# Patient Record
Sex: Female | Born: 1950
Health system: Southern US, Community
[De-identification: ages and names within clinical notes are randomized; demographics above are authoritative.]

## PROBLEM LIST (undated history)

## (undated) DIAGNOSIS — R0600 Dyspnea, unspecified: Secondary | ICD-10-CM

## (undated) DIAGNOSIS — T8859XA Other complications of anesthesia, initial encounter: Secondary | ICD-10-CM

## (undated) DIAGNOSIS — Z9889 Other specified postprocedural states: Secondary | ICD-10-CM

## (undated) DIAGNOSIS — H409 Unspecified glaucoma: Secondary | ICD-10-CM

## (undated) DIAGNOSIS — I739 Peripheral vascular disease, unspecified: Secondary | ICD-10-CM

## (undated) DIAGNOSIS — J449 Chronic obstructive pulmonary disease, unspecified: Secondary | ICD-10-CM

## (undated) DIAGNOSIS — R112 Nausea with vomiting, unspecified: Secondary | ICD-10-CM

## (undated) DIAGNOSIS — K219 Gastro-esophageal reflux disease without esophagitis: Secondary | ICD-10-CM

## (undated) HISTORY — PX: TONSILLECTOMY: SUR1361

## (undated) HISTORY — DX: Chronic obstructive pulmonary disease, unspecified: J44.9

## (undated) HISTORY — PX: ROTATOR CUFF REPAIR: SHX139

## (undated) HISTORY — PX: EYE SURGERY: SHX253

## (undated) HISTORY — PX: ABDOMINAL HYSTERECTOMY: SHX81

## (undated) HISTORY — DX: Unspecified glaucoma: H40.9

## (undated) HISTORY — DX: Peripheral vascular disease, unspecified: I73.9

---

## 2010-10-09 ENCOUNTER — Ambulatory Visit (HOSPITAL_COMMUNITY): Admission: RE | Admit: 2010-10-09 | Discharge: 2010-10-09 | Payer: Self-pay | Admitting: Gastroenterology

## 2011-02-13 ENCOUNTER — Ambulatory Visit (HOSPITAL_COMMUNITY): Payer: Self-pay | Admitting: Oncology

## 2015-08-22 ENCOUNTER — Ambulatory Visit (INDEPENDENT_AMBULATORY_CARE_PROVIDER_SITE_OTHER): Payer: Self-pay | Admitting: Neurology

## 2015-08-22 ENCOUNTER — Ambulatory Visit (INDEPENDENT_AMBULATORY_CARE_PROVIDER_SITE_OTHER): Payer: Managed Care, Other (non HMO) | Admitting: Neurology

## 2015-08-22 DIAGNOSIS — R202 Paresthesia of skin: Secondary | ICD-10-CM

## 2015-08-22 DIAGNOSIS — Z0289 Encounter for other administrative examinations: Secondary | ICD-10-CM

## 2015-08-22 NOTE — Procedures (Signed)
   NCS (NERVE CONDUCTION STUDY) WITH EMG (ELECTROMYOGRAPHY) REPORT   STUDY DATE: August 22 2015 PATIENT NAME: Tami Dickson DOB: Dec 09, 1950 MRN: [id number]    TECHNOLOGIST: Laretta Alstrom ELECTROMYOGRAPHER: Marcial Pacas M.D.  CLINICAL INFORMATION:  64 year old female with few months history of low back pain, top of left foot pain, paresthesia involving left second to forced toes.  FINDINGS: NERVE CONDUCTION STUDY: Bilateral peroneal sensory responses were normal. Bilateral peroneal to EDB, tibial motor responses were normal. Bilateral tibial H reflexes were normal and symmetric.  NEEDLE ELECTROMYOGRAPHY: Selective needle examinations were performed at left lower extremity muscles, left lumbar sacral paraspinal muscles.  Needle examination of left tibialis anterior, tibialis posterior, peroneal longus, medial gastrocnemius, vastus lateralis, biceps femoris short head, gluteus medius was normal.  There was no spontaneous activity at left lumbosacral paraspinal muscles.  IMPRESSION:  This is a normal study. There is no electrodiagnostic evidence of peripheral neuropathy, or left lumbosacral radiculopathy.    INTERPRETING PHYSICIAN:   Marcial Pacas M.D. Ph.D. [facility] [address] [place], [place] [address] [phone]

## 2015-08-22 NOTE — Progress Notes (Signed)
Electrodiagnostic study today is normal, there is no evidence of large fiber peripheral neuropathy, or left lumbosacral radiculopathy.

## 2018-04-17 DIAGNOSIS — Z780 Asymptomatic menopausal state: Secondary | ICD-10-CM | POA: Diagnosis not present

## 2018-04-17 DIAGNOSIS — E2839 Other primary ovarian failure: Secondary | ICD-10-CM | POA: Diagnosis not present

## 2018-06-06 DIAGNOSIS — Z79899 Other long term (current) drug therapy: Secondary | ICD-10-CM | POA: Diagnosis not present

## 2018-06-06 DIAGNOSIS — S81801A Unspecified open wound, right lower leg, initial encounter: Secondary | ICD-10-CM | POA: Diagnosis not present

## 2018-06-06 DIAGNOSIS — E7800 Pure hypercholesterolemia, unspecified: Secondary | ICD-10-CM | POA: Diagnosis not present

## 2018-06-06 DIAGNOSIS — I83891 Varicose veins of right lower extremities with other complications: Secondary | ICD-10-CM | POA: Diagnosis not present

## 2018-06-06 DIAGNOSIS — Z888 Allergy status to other drugs, medicaments and biological substances status: Secondary | ICD-10-CM | POA: Diagnosis not present

## 2018-06-06 DIAGNOSIS — W228XXA Striking against or struck by other objects, initial encounter: Secondary | ICD-10-CM | POA: Diagnosis not present

## 2018-07-22 DIAGNOSIS — M7061 Trochanteric bursitis, right hip: Secondary | ICD-10-CM | POA: Diagnosis not present

## 2018-07-22 DIAGNOSIS — Z6837 Body mass index (BMI) 37.0-37.9, adult: Secondary | ICD-10-CM | POA: Diagnosis not present

## 2018-07-23 ENCOUNTER — Other Ambulatory Visit: Payer: Self-pay

## 2018-07-23 DIAGNOSIS — I83893 Varicose veins of bilateral lower extremities with other complications: Secondary | ICD-10-CM

## 2018-07-25 ENCOUNTER — Ambulatory Visit (HOSPITAL_COMMUNITY)
Admission: RE | Admit: 2018-07-25 | Discharge: 2018-07-25 | Disposition: A | Discharge status: Home / Self Care | Payer: Managed Care, Other (non HMO) | Source: Ambulatory Visit | Attending: Family | Admitting: Family

## 2018-07-25 DIAGNOSIS — I83893 Varicose veins of bilateral lower extremities with other complications: Secondary | ICD-10-CM | POA: Diagnosis not present

## 2018-07-31 ENCOUNTER — Ambulatory Visit (INDEPENDENT_AMBULATORY_CARE_PROVIDER_SITE_OTHER): Payer: Managed Care, Other (non HMO) | Admitting: Vascular Surgery

## 2018-07-31 ENCOUNTER — Encounter: Payer: Self-pay | Admitting: Vascular Surgery

## 2018-07-31 VITALS — BP 169/87 | HR 73 | Temp 97.6°F | Resp 16 | Ht 63.0 in | Wt 210.0 lb

## 2018-07-31 DIAGNOSIS — I872 Venous insufficiency (chronic) (peripheral): Secondary | ICD-10-CM | POA: Diagnosis not present

## 2018-07-31 NOTE — Progress Notes (Signed)
REASON FOR CONSULT:    Bilateral varicose veins with recent bleeding episode.  The consult is requested by Dr. Gar Ponto.  HPI:   Tami Dickson is a pleasant 67 y.o. female, who is referred with bilateral varicose veins.  I have reviewed the records from the referring office.  The patient was seen on 07/22/2018.  She was evaluated for hip pain and had bursitis in her hip.  On my history, on July 4 she had bleeding from a varicose vein in her anterior lateral right leg which bled significantly.  She subsequently bled the following day and went to the emergency department.  She has not had any bleeding episodes since then.  She is unaware of any previous history of DVT or phlebitis.  She does describe aching heavy feeling in both legs which is aggravated by standing and sitting.  Her symptoms are relieved somewhat with elevation.  She has not worn compression stockings.  She does take ibuprofen as needed for pain.  She does not have any risk factors for peripheral vascular disease.  She denies any history of claudication or rest pain.  Past Medical History:  Diagnosis Date  . COPD (chronic obstructive pulmonary disease) ([place])   . Glaucoma   . Peripheral vascular disease ([place])     Family History  Problem Relation Age of Onset  . Heart disease Mother   . Varicose Veins Mother   . Heart disease Father   . Varicose Veins Sister   . Hypertension Sister     SOCIAL HISTORY: Social History   Socioeconomic History  . Marital status: Widowed    Spouse name: Not on file  . Number of children: Not on file  . Years of education: Not on file  . Highest education level: Not on file  Occupational History  . Not on file  Social Needs  . Financial resource strain: Not on file  . Food insecurity:    Worry: Not on file    Inability: Not on file  . Transportation needs:    Medical: Not on file    Non-medical: Not on file  Tobacco Use  . Smoking status: Never Smoker  .  Smokeless tobacco: Never Used  Substance and Sexual Activity  . Alcohol use: Not Currently  . Drug use: Never  . Sexual activity: Not Currently  Lifestyle  . Physical activity:    Days per week: Not on file    Minutes per session: Not on file  . Stress: Not on file  Relationships  . Social connections:    Talks on phone: Not on file    Gets together: Not on file    Attends religious service: Not on file    Active member of club or organization: Not on file    Attends meetings of clubs or organizations: Not on file    Relationship status: Not on file  . Intimate partner violence:    Fear of current or ex partner: Not on file    Emotionally abused: Not on file    Physically abused: Not on file    Forced sexual activity: Not on file  Other Topics Concern  . Not on file  Social History Narrative  . Not on file    No Known Allergies  Current Outpatient Medications  Medication Sig Dispense Refill  . esomeprazole (NEXIUM) 40 MG capsule Take 40 mg by mouth 1 day or 1 dose.    . dorzolamide-timolol (COSOPT) 22.3-6.8 MG/ML ophthalmic solution Place 1  drop into both eyes 2 (two) times daily.  11  . latanoprost (XALATAN) 0.005 % ophthalmic solution 1 drop.  3  . montelukast (SINGULAIR) 10 MG tablet Take 2.5 mg by mouth 1 day or 1 dose.  1   No current facility-administered medications for this visit.     REVIEW OF SYSTEMS:  [X]  denotes positive finding, [ ]  denotes negative finding Cardiac  Comments:  Chest pain or chest pressure: x   Shortness of breath upon exertion: x   Short of breath when lying flat: x   Irregular heart rhythm:        Vascular    Pain in calf, thigh, or hip brought on by ambulation: x   Pain in feet at night that wakes you up from your sleep:     Blood clot in your veins:    Leg swelling:  x       Pulmonary    Oxygen at home:    Productive cough:     Wheezing:         Neurologic    Sudden weakness in arms or legs:     Sudden numbness in arms or  legs:     Sudden onset of difficulty speaking or slurred speech:    Temporary loss of vision in one eye:     Problems with dizziness:  x       Gastrointestinal    Blood in stool:     Vomited blood:         Genitourinary    Burning when urinating:     Blood in urine:        Psychiatric    Major depression:         Hematologic    Bleeding problems:    Problems with blood clotting too easily:        Skin    Rashes or ulcers:        Constitutional    Fever or chills:     PHYSICAL EXAM:   Vitals:   07/31/18 1420  BP: (!) 169/87  Pulse: 73  Resp: 16  Temp: 97.6 F (36.4 C)  SpO2: 98%  Weight: 210 lb (95.3 kg)  Height: 5\' 3"  (1.6 m)    GENERAL: The patient is a well-nourished female, in no acute distress. The vital signs are documented above. CARDIAC: There is a regular rate and rhythm.  VASCULAR: I do not detect carotid bruits. She has palpable dorsalis pedis pulses bilaterally. She has mild bilateral lower extremity swelling. She has multiple telangiectasias and spider veins in both lower extremities with a large cluster on the anterior lateral right leg where she bled.  She also has a protruding vein in her lateral right thigh.  She also has multiple areas in the left leg with some larger varicose veins in the medial thigh.  I do not appreciate any significant hyperpigmentation. PULMONARY: There is good air exchange bilaterally without wheezing or rales. ABDOMEN: Soft and non-tender with normal pitched bowel sounds.  MUSCULOSKELETAL: There are no major deformities or cyanosis. NEUROLOGIC: No focal weakness or paresthesias are detected. SKIN: There are no ulcers or rashes noted. PSYCHIATRIC: The patient has a normal affect.  DATA:    VENOUS DUPLEX: I have reviewed the venous duplex scan that was done yesterday.  On the right side there is no evidence of deep venous thrombosis and no evidence of superficial venous thrombosis.  There is no significant deep venous  reflux or superficial venous reflux.  On the left side there is no evidence of deep venous thrombosis or superficial venous thrombosis.  There is deep venous reflux involving the common femoral vein.  There is superficial venous reflux involving the saphenofemoral junction and the great saphenous vein in the proximal thigh.   ASSESSMENT & PLAN:   CHRONIC VENOUS INSUFFICIENCY: This patient has had a bleeding episode in the right leg.  She does not have any significant deep venous reflux or superficial venous reflux on the right.  If she has recurrent problems I think should she could be considered for sclerotherapy on the right side.  On the left side she does have significant pain and has reflux in the left great saphenous vein in the thigh.  I looked at the great saphenous vein with the SonoSite myself and appears that we could access the vein in the mid to proximal thigh to address the area of incompetent saphenous vein.  The vein below that is small.  She would also require sclerotherapy on the left side.  However before considering this I have recommended that we try thigh-high compression stockings with a gradient of 20 to 30 mmHg.  We have also discussed the importance of intermittent leg elevation the proper positioning for this.  I have encouraged her to avoid prolonged sitting and standing.  I encouraged her to exercise we have also discussed weight management.  In addition I have given her multiple recommendations if she has any further bleeding problems.  He knows to elevate her leg, hold direct pressure over the area of bleeding and also to keep her skin well lubricated if it gets dry.  I will see her back in 3 months.  She knows to call sooner if she has problems.  I spent 45 minutes on this visit.  50% of this time was face-to-face.  Deitra Mayo [facility] [phone]

## 2018-08-05 ENCOUNTER — Ambulatory Visit (INDEPENDENT_AMBULATORY_CARE_PROVIDER_SITE_OTHER): Payer: Managed Care, Other (non HMO)

## 2018-08-05 DIAGNOSIS — I868 Varicose veins of other specified sites: Secondary | ICD-10-CM

## 2018-08-05 DIAGNOSIS — I8393 Asymptomatic varicose veins of bilateral lower extremities: Secondary | ICD-10-CM

## 2018-08-05 NOTE — Progress Notes (Signed)
0.3% Sotradecol administered with a 27g butterfly.  Patient received a total of 24cc.  Treated patient's widespread spider veins on both legs, including area that has bled and resulted in an ED visit. Pt. Tolerated well.  Likely will need further treatments. Expect good results. Pt. Given post procedure care instructions both verbally and on handout to take home. Will follow PRN. Photos: No.  Compression stockings applied: Yes.

## 2018-10-07 DIAGNOSIS — D3132 Benign neoplasm of left choroid: Secondary | ICD-10-CM | POA: Diagnosis not present

## 2018-10-07 DIAGNOSIS — Z961 Presence of intraocular lens: Secondary | ICD-10-CM | POA: Diagnosis not present

## 2018-10-07 DIAGNOSIS — H43823 Vitreomacular adhesion, bilateral: Secondary | ICD-10-CM | POA: Diagnosis not present

## 2018-10-07 DIAGNOSIS — H0288A Meibomian gland dysfunction right eye, upper and lower eyelids: Secondary | ICD-10-CM | POA: Diagnosis not present

## 2018-10-07 DIAGNOSIS — H0288B Meibomian gland dysfunction left eye, upper and lower eyelids: Secondary | ICD-10-CM | POA: Diagnosis not present

## 2018-10-07 DIAGNOSIS — G510 Bell's palsy: Secondary | ICD-10-CM | POA: Diagnosis not present

## 2018-11-06 ENCOUNTER — Ambulatory Visit: Payer: Managed Care, Other (non HMO) | Admitting: Vascular Surgery

## 2018-11-13 ENCOUNTER — Other Ambulatory Visit: Payer: Self-pay

## 2018-11-13 ENCOUNTER — Encounter: Payer: Self-pay | Admitting: Vascular Surgery

## 2018-11-13 ENCOUNTER — Ambulatory Visit (INDEPENDENT_AMBULATORY_CARE_PROVIDER_SITE_OTHER): Payer: PPO | Admitting: Vascular Surgery

## 2018-11-13 VITALS — BP 135/75 | HR 64 | Temp 97.9°F | Resp 20 | Ht 63.0 in | Wt 213.0 lb

## 2018-11-13 DIAGNOSIS — I83893 Varicose veins of bilateral lower extremities with other complications: Secondary | ICD-10-CM

## 2018-11-13 NOTE — Progress Notes (Signed)
Patient name: Tami Dickson MRN: [id number] DOB: 1951/11/18 Sex: female  REASON FOR VISIT:   Follow-up of chronic venous insufficiency.  HPI:   Tami Dickson is a pleasant 67 y.o. female who I last saw on 07/31/2018.  She had been referred with bilateral varicose veins with a recent bleeding episode.  On July 4 she had an episode of significant bleeding from a varicose vein on the anterior lateral aspect of her right leg.  She bled again the following day and went to the emergency department and this was stopped and she is had no further bleeding episodes.  On exam she had palpable pedal pulses bilaterally.  She had bilateral lower extremity swelling.  She had multiple telangiectasias and spider veins of both lower extremities with a large cluster on the anterior lateral right leg which is where she had bled.  Her noninvasive studies at that time showed that on the right side there was no evidence of deep venous thrombosis or superficial venous thrombosis.  There was no significant deep or superficial venous reflux on the right.  On the left side there was no evidence of DVT or superficial venous thrombosis.  There was some deep venous reflux in the common femoral vein and superficial venous reflux at the saphenofemoral junction and in the left great saphenous vein in the proximal thigh.  I felt that if she had recurrent bleeding problems she could be considered for sclerotherapy on the right side.  I looked at the saphenous vein on the left with the SonoSite and it looks like we could potentially address the reflux in the left great saphenous vein in the proximal thigh.  Below that the vein was small.  However I felt we should continue conservative treatment and I recommended that she try thigh-high compression stockings with a gradient of 20 to 30 mmHg.  She comes in for a 63-month follow-up visit.  Since I saw her last she has had no further bleeding episodes.  She did undergo  sclerotherapy in the area that had previously bled and this did well.  She continues to have some aching pain and heaviness in both legs which is aggravated by standing and relieved somewhat with elevation.  She has been wearing her thigh-high compression stockings and these do help some.  Past Medical History:  Diagnosis Date  . COPD (chronic obstructive pulmonary disease) ([place])   . Glaucoma   . Peripheral vascular disease ([place])     Family History  Problem Relation Age of Onset  . Heart disease Mother   . Varicose Veins Mother   . Heart disease Father   . Varicose Veins Sister   . Hypertension Sister     SOCIAL HISTORY: Social History   Tobacco Use  . Smoking status: Never Smoker  . Smokeless tobacco: Never Used  Substance Use Topics  . Alcohol use: Not Currently    Allergies  Allergen Reactions  . Atorvastatin Other (See Comments)  . Statins Other (See Comments)    Joints hurt    Current Outpatient Medications  Medication Sig Dispense Refill  . dorzolamide-timolol (COSOPT) 22.3-6.8 MG/ML ophthalmic solution Place 1 drop into both eyes 2 (two) times daily.  11  . esomeprazole (NEXIUM) 40 MG capsule Take 40 mg by mouth 1 day or 1 dose.    . latanoprost (XALATAN) 0.005 % ophthalmic solution 1 drop.  3  . montelukast (SINGULAIR) 10 MG tablet Take 2.5 mg by mouth 1 day or 1 dose.  1  No current facility-administered medications for this visit.     REVIEW OF SYSTEMS:  [X]  denotes positive finding, [ ]  denotes negative finding Cardiac  Comments:  Chest pain or chest pressure:    Shortness of breath upon exertion:    Short of breath when lying flat:    Irregular heart rhythm:        Vascular    Pain in calf, thigh, or hip brought on by ambulation:    Pain in feet at night that wakes you up from your sleep:     Blood clot in your veins:    Leg swelling:         Pulmonary    Oxygen at home:    Productive cough:     Wheezing:         Neurologic    Sudden  weakness in arms or legs:     Sudden numbness in arms or legs:     Sudden onset of difficulty speaking or slurred speech:    Temporary loss of vision in one eye:     Problems with dizziness:         Gastrointestinal    Blood in stool:     Vomited blood:         Genitourinary    Burning when urinating:     Blood in urine:        Psychiatric    Major depression:         Hematologic    Bleeding problems:    Problems with blood clotting too easily:        Skin    Rashes or ulcers:        Constitutional    Fever or chills:     PHYSICAL EXAM:   Vitals:   11/13/18 1244  BP: 135/75  Pulse: 64  Resp: 20  Temp: 97.9 F (36.6 C)  SpO2: 96%  Weight: 213 lb (96.6 kg)  Height: 5\' 3"  (1.6 m)    GENERAL: The patient is a well-nourished female, in no acute distress. The vital signs are documented above. CARDIAC: There is a regular rate and rhythm.  VASCULAR: She has spider veins in both thighs and both legs.  She has some mild hyperpigmentation.  She has some mild bilateral lower extremity swelling. PULMONARY: There is good air exchange bilaterally without wheezing or rales. MUSCULOSKELETAL: There are no major deformities or cyanosis. NEUROLOGIC: No focal weakness or paresthesias are detected. SKIN: There are no ulcers or rashes noted. PSYCHIATRIC: The patient has a normal affect.  DATA:    No new data  MEDICAL ISSUES:   CHRONIC VENOUS INSUFFICIENCY: This patient has CEAP C-3 venous disease.  Her symptoms currently are tolerable and therefore I would not plan on proceeding with endovenous laser ablation of the left great saphenous vein in the thigh.  This is the only real area with significant reflux where the vein is dilated.  We have again discussed the importance of intermittent leg elevation and the proper positioning for this.  She will continue to wear her compression stockings.  I will see her back in 6 months and we can follow her venous disease if things progress we  may need to repeat her reflux study.  We also discussed potentially getting sclerotherapy.  She understands this is not covered by insurance.  Deitra Mayo [facility] [phone]

## 2018-12-25 DIAGNOSIS — E782 Mixed hyperlipidemia: Secondary | ICD-10-CM | POA: Diagnosis not present

## 2018-12-25 DIAGNOSIS — F324 Major depressive disorder, single episode, in partial remission: Secondary | ICD-10-CM | POA: Diagnosis not present

## 2018-12-25 DIAGNOSIS — K210 Gastro-esophageal reflux disease with esophagitis: Secondary | ICD-10-CM | POA: Diagnosis not present

## 2018-12-25 DIAGNOSIS — I10 Essential (primary) hypertension: Secondary | ICD-10-CM | POA: Diagnosis not present

## 2018-12-25 DIAGNOSIS — G608 Other hereditary and idiopathic neuropathies: Secondary | ICD-10-CM | POA: Diagnosis not present

## 2018-12-29 DIAGNOSIS — E782 Mixed hyperlipidemia: Secondary | ICD-10-CM | POA: Diagnosis not present

## 2018-12-29 DIAGNOSIS — Z23 Encounter for immunization: Secondary | ICD-10-CM | POA: Diagnosis not present

## 2018-12-29 DIAGNOSIS — Z0001 Encounter for general adult medical examination with abnormal findings: Secondary | ICD-10-CM | POA: Diagnosis not present

## 2018-12-29 DIAGNOSIS — Z6837 Body mass index (BMI) 37.0-37.9, adult: Secondary | ICD-10-CM | POA: Diagnosis not present

## 2018-12-29 DIAGNOSIS — K219 Gastro-esophageal reflux disease without esophagitis: Secondary | ICD-10-CM | POA: Diagnosis not present

## 2018-12-29 DIAGNOSIS — R05 Cough: Secondary | ICD-10-CM | POA: Diagnosis not present

## 2018-12-29 DIAGNOSIS — Z1212 Encounter for screening for malignant neoplasm of rectum: Secondary | ICD-10-CM | POA: Diagnosis not present

## 2018-12-29 DIAGNOSIS — M545 Low back pain: Secondary | ICD-10-CM | POA: Diagnosis not present

## 2019-01-16 ENCOUNTER — Encounter: Payer: Self-pay | Admitting: Allergy & Immunology

## 2019-01-16 ENCOUNTER — Ambulatory Visit (INDEPENDENT_AMBULATORY_CARE_PROVIDER_SITE_OTHER): Payer: PPO | Admitting: Allergy & Immunology

## 2019-01-16 VITALS — BP 170/90 | HR 60 | Temp 97.6°F | Resp 18 | Ht 63.0 in | Wt 215.6 lb

## 2019-01-16 DIAGNOSIS — J449 Chronic obstructive pulmonary disease, unspecified: Secondary | ICD-10-CM | POA: Diagnosis not present

## 2019-01-16 DIAGNOSIS — J310 Chronic rhinitis: Secondary | ICD-10-CM | POA: Diagnosis not present

## 2019-01-16 DIAGNOSIS — J4489 Other specified chronic obstructive pulmonary disease: Secondary | ICD-10-CM | POA: Insufficient documentation

## 2019-01-16 MED ORDER — ALBUTEROL SULFATE HFA 108 (90 BASE) MCG/ACT IN AERS
2.0000 | INHALATION_SPRAY | Freq: Four times a day (QID) | RESPIRATORY_TRACT | 1 refills | Status: DC | PRN
Start: 2019-01-16 — End: 2019-01-16

## 2019-01-16 MED ORDER — TRIAMCINOLONE ACETONIDE 55 MCG/ACT NA AERO: 1.0000 | INHALATION_SPRAY | Freq: Every day | NASAL | 3 refills | Status: DC

## 2019-01-16 MED ORDER — AZELASTINE HCL 0.1 % NA SOLN: 2.0000 | Freq: Two times a day (BID) | NASAL | 3 refills | Status: DC | PRN

## 2019-01-16 MED ORDER — ALBUTEROL SULFATE HFA 108 (90 BASE) MCG/ACT IN AERS: 2.0000 | INHALATION_SPRAY | Freq: Four times a day (QID) | RESPIRATORY_TRACT | 1 refills | Status: AC | PRN

## 2019-01-16 MED ORDER — FEXOFENADINE HCL 180 MG PO TABS: 180.0000 mg | ORAL_TABLET | Freq: Every day | ORAL | 5 refills | Status: DC

## 2019-01-16 MED ORDER — BUDESONIDE-FORMOTEROL FUMARATE 160-4.5 MCG/ACT IN AERO: 2.0000 | INHALATION_SPRAY | Freq: Two times a day (BID) | RESPIRATORY_TRACT | 5 refills | Status: DC

## 2019-01-16 NOTE — Progress Notes (Signed)
NEW PATIENT  Date of Service/Encounter:  01/16/19  Referring provider: Caryl Bis, MD   Assessment:   Asthma-COPD overlap syndrome   Chronic rhinitis  Plan/Recommendations:    1. Asthma-COPD overlap syndrome ([place]) - Lung testing looked terrible today, but it did improve with the nebulizer treatment. - Because it looked so bad today, I think we need to start a daily controller medication: Symbicort - Spacer use reviewed. - Daily controller medication(s): Symbicort 160/4.9mcg two puffs twice daily with spacer - Prior to physical activity: ProAir 2 puffs 10-15 minutes before physical activity. - Rescue medications: ProAir 4 puffs every 4-6 hours as needed or albuterol nebulizer one vial every 4-6 hours as needed - Asthma control goals:  * Full participation in all desired activities (may need albuterol before activity) * Albuterol use two time or less a week on average (not counting use with activity) * Cough interfering with sleep two time or less a month * Oral steroids no more than once a year * No hospitalizations  2. Chronic rhinitis - Testing today showed: negative to the entire panel - Copy of test results provided.  - Avoidance measures provided. - Stop taking: fluticasone - Start taking: Allegra (fexofenadine) 180mg  table once daily, Nasacort (triamcinolone) one spray per nostril daily and Astelin (azelastine) 2 sprays per nostril 1-2 times daily as needed - You can use an extra dose of the antihistamine, if needed, for breakthrough symptoms.  - Consider nasal saline rinses 1-2 times daily to remove allergens from the nasal cavities as well as help with mucous clearance (this is especially helpful to do before the nasal sprays are given) - We are going to get some labs to confirm that the testing was indeed negative.   3. Adverse food reaction - Testing was negative to the most common foods.  - There is a the low positive predictive value of food allergy  testing and hence the high possibility of false positives. - In contrast, food allergy testing has a high negative predictive value, therefore if testing is negative we can be relatively assured that they are indeed negative.  - Your symptoms did not seem consistent with a food allergy, so I believe the testing today makes sense.  4. Return in about 2 months (around 03/17/2019).  Subjective:   Tami Dickson is a 68 y.o. female presenting today for evaluation of  Chief Complaint  Patient presents with  . Cough  . Nasal Congestion    post nasal drip     Tami Dickson has a history of the following: Patient Active Problem List   Diagnosis Date Noted  . Asthma-COPD overlap syndrome ([place]) 01/16/2019  . Chronic rhinitis 01/16/2019    History obtained from: chart review and patient.  Tami Dickson was referred by Caryl Bis, MD.     Tami Dickson is a 68 y.o. female presenting for a visit for postnasal drip and cough.   Asthma/Respiratory Symptom History: She does have a history of COPD. She is on albuterol nebs as needed. She is not on a maintenance medication. She was started on albuterol nebulizers when she was working in [employer]. At the time she was having problems with going up steps and experiencing more enhanced physical activity. Since stopping the work in [employer] she has had fewer symptoms. She reports a globus sensation with mucous production.  Allergic Rhinitis Symptom History: She has a history of chronic rhinitis that is long-standing. Her coughing was worse when she  was working in [employer]. She has constant rhinorrhea with  A cough that is worse when she goes to bed and when she eats something. She does not think that she is coughing with eating, but it is hard to tell. She was started on allergy pills by her PCP a "long time ago". She took them and it seemed to help. But then it came to the point when it did not help - for instance she would have to take  more than one in 24 hours. She was also placed on Singulair, but it caused sleepiness. She event tried cutting it in half. She was on nose sprays when she was sick with a cold. She has never been allergy tested in the past. She does have five cats which she does not think is a problem for her.   Food Allergy Symptom History: She reports that she might have some food allergies. Symptoms do not seem to worsen when she eats a particular type of food. She denies any itching, throat swelling, or stomach pain with a particular food. She tolerates all of the major food allergens without adverse event.   [place] her ear infections with boiled pine needles. She does react to poison oak very badly.   Otherwise, there is no history of other atopic diseases, including drug allergies, stinging insect allergies or eczema. There is no significant infectious history. Vaccinations are up to date.    Past Medical History: Patient Active Problem List   Diagnosis Date Noted  . Asthma-COPD overlap syndrome ([place]) 01/16/2019  . Chronic rhinitis 01/16/2019    Medication List:  Allergies as of 01/16/2019      Reactions   Atorvastatin Other (See Comments)   Codeine Nausea And Vomiting   Statins Other (See Comments)   Joints hurt      Medication List       Accurate as of January 16, 2019  1:33 PM. Always use your most recent med list.        albuterol 108 (90 Base) MCG/ACT inhaler Commonly known as:  PROVENTIL HFA;VENTOLIN HFA Inhale 2 puffs into the lungs every 6 (six) hours as needed for wheezing or shortness of breath.   albuterol 0.63 MG/3ML nebulizer solution Commonly known as:  ACCUNEB Inhale into the lungs.   azelastine 0.1 % nasal spray Commonly known as:  ASTELIN Place 2 sprays into both nostrils 2 (two) times daily as needed for rhinitis. Use in each nostril as directed   budesonide-formoterol 160-4.5 MCG/ACT inhaler Commonly known as:  SYMBICORT Inhale 2  puffs into the lungs 2 (two) times daily.   dorzolamide-timolol 22.3-6.8 MG/ML ophthalmic solution Commonly known as:  COSOPT Place 1 drop into both eyes 2 (two) times daily.   erythromycin ophthalmic ointment Place into both eyes nightly.   fexofenadine 180 MG tablet Commonly known as:  ALLEGRA Take 1 tablet (180 mg total) by mouth daily.   latanoprost 0.005 % ophthalmic solution Commonly known as:  XALATAN 1 drop.   Loratadine 10 MG Caps Take by mouth.   naproxen 500 MG tablet Commonly known as:  NAPROSYN Take by mouth.   triamcinolone 55 MCG/ACT Aero nasal inhaler Commonly known as:  NASACORT Place 1 spray into the nose daily.       Birth History: non-contributory  Developmental History: non-contributory.   Past Surgical History: Past Surgical History:  Procedure Laterality Date  . ABDOMINAL HYSTERECTOMY    . EYE SURGERY Bilateral   .  ROTATOR CUFF REPAIR    . TONSILLECTOMY       Family History: Family History  Problem Relation Age of Onset  . Heart disease Mother   . Varicose Veins Mother   . Heart disease Father   . Varicose Veins Sister   . Hypertension Sister   . Allergic rhinitis Neg Hx   . Asthma Neg Hx      Social History: Miranda lives at home with her her son, his wife, and her grandson.  Apparently her son is currently in the midst of building a house, which is why they are there.  She is officially retired but she worked at [employer] for 22 years for the one [place] but 35 years altogether. She lives in [place] in a house that was built in 1972. There is wood flooring in the main living areas.  She has gas heating and central cooling.  There are 2 dogs and 5 cats in the home.  There are no dust mite covers on the bed over the pillows.  She is exposed to tobacco, but she does not smoke.  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    Review of Systems  Constitutional: Negative.   Negative for fever, malaise/fatigue and weight loss.  HENT: Negative.  Negative for congestion, ear discharge and ear pain.   Eyes: Positive for blurred vision. Negative for pain, discharge and redness.       History of narrow angle glaucoma  Respiratory: Negative for cough, sputum production, shortness of breath and wheezing.   Cardiovascular: Negative.  Negative for chest pain and palpitations.  Gastrointestinal: Negative for abdominal pain, blood in stool, constipation, diarrhea and heartburn.  Musculoskeletal: Negative for myalgias.  Skin: Negative.  Negative for itching and rash.  Neurological: Positive for focal weakness. Negative for dizziness and headaches.  Endo/Heme/Allergies: Negative for environmental allergies. Does not bruise/bleed easily.       Objective:   Blood pressure (!) 170/90, pulse 60, temperature 97.6 F (36.4 C), temperature source Oral, resp. rate 18, height 5\' 3"  (1.6 m), weight 215 lb 9.6 oz (97.8 kg), SpO2 98 %. Body mass index is 38.19 kg/m.   Physical Exam:   Physical Exam  Constitutional: She appears well-developed.  Flight of ideas. Difficult for her to get a complete thought out.  HENT:  Head: Normocephalic and atraumatic.  Right Ear: Tympanic membrane, external ear and ear canal normal. No drainage, swelling or tenderness. Tympanic membrane is not injected, not scarred, not erythematous, not retracted and not bulging.  Left Ear: Tympanic membrane, external ear and ear canal normal. No drainage, swelling or tenderness. Tympanic membrane is not injected, not scarred, not erythematous, not retracted and not bulging.  Nose: No mucosal edema, rhinorrhea, nasal deformity or septal deviation. No epistaxis. Right sinus exhibits no maxillary sinus tenderness and no frontal sinus tenderness. Left sinus exhibits no maxillary sinus tenderness and no frontal sinus tenderness.  Mouth/Throat: Uvula is midline and oropharynx is clear and moist. Mucous membranes  are not pale and not dry.  Eyes: Pupils are equal, round, and reactive to light. Conjunctivae and EOM are normal. Right eye exhibits no chemosis and no discharge. Left eye exhibits no chemosis and no discharge. Right conjunctiva is not injected. Left conjunctiva is not injected.  Cardiovascular: Normal rate, regular rhythm and normal heart sounds.  Respiratory: Effort normal and breath sounds normal. No accessory muscle usage. No tachypnea. No respiratory distress.  She has no wheezes. She has no rhonchi. She has no rales. She exhibits no tenderness.  GI: There is no abdominal tenderness. There is no rebound and no guarding.  Lymphadenopathy:       Head (right side): No submandibular, no tonsillar and no occipital adenopathy present.       Head (left side): No submandibular, no tonsillar and no occipital adenopathy present.    She has no cervical adenopathy.  Neurological: She is alert.  Skin: No abrasion, no petechiae and no rash noted. Rash is not papular, not vesicular and not urticarial. No erythema. No pallor.  She does have dry skin over her forehead with some lichenification. There is some erythema around the left eye periorbital region noted especially on the lower eye lid.  Psychiatric: She has a normal mood and affect.     Diagnostic studies:    Spirometry: results abnormal (FEV1: 1.16/57%, FVC: 1.75/63%, FEV1/FVC: 65%).    Spirometry consistent with mixed obstructive and restrictive disease. Xopenex/Atrovent nebulizer treatment given in clinic with improvement in FEV1 and FVC, but not significant per ATS criteria.  Allergy Studies:    Airborne Adult Perc - 01/16/19 0905    Time Antigen Placed  5573    Allergen Manufacturer  Lavella Hammock    Location  Back    Number of Test  59    Panel 1  Select    1. Control-Buffer 50% Glycerol  Negative    2. Control-Histamine 1 mg/ml  2+    3. Albumin saline  Negative    4. Laurel  Negative    5. [place]  Negative    6. [place]  Negative    7.  K[place]wood Blue  Negative    8. Meadow Fescue  Negative    9. Perennial Rye  Negative    10. Sweet Vernal  Negative    11. Timothy  Negative    12. Cocklebur  Negative    13. Burweed Marshelder  Negative    14. Ragweed, short  Negative    15. Ragweed, Giant  Negative    16. Plantain,  English  Negative    17. Lamb's Quarters  Negative    18. Sheep Sorrell  Negative    19. Rough Pigweed  Negative    20. Marsh Elder, Rough  Negative    21. Mugwort, Common  Negative    22. Ash mix  Negative    23. Birch mix  Negative    24. Beech American  Negative    25. Box, Elder  Negative    26. Cedar, red  Negative    27. Cottonwood, [place]  Negative    28. Elm mix  Negative    29. Hickory mix  Negative    30. Maple mix  Negative    31. Oak, [place] mix  Negative    32. Pecan Pollen  Negative    33. Pine mix  Negative    34. Sycamore Eastern  Negative    35. [place], Black Pollen  Negative    36. Alternaria alternata  Negative    37. Cladosporium Herbarum  Negative    38. Aspergillus mix  Negative    39. Penicillium mix  Negative    40. Bipolaris sorokiniana (Helminthosporium)  Negative    41. Drechslera spicifera (Curvularia)  Negative    42. Mucor plumbeus  Negative    43. Fusarium moniliforme  Negative    44. Aureobasidium pullulans (pullulara)  Negative    45. Rhizopus oryzae  Negative    46. Botrytis cinera  Negative    47. Epicoccum nigrum  Negative    48. Phoma betae  Negative    49. Candida Albicans  Negative    50. Trichophyton mentagrophytes  Negative    51. Mite, D Farinae  5,000 AU/ml  Negative    52. Mite, D Pteronyssinus  5,000 AU/ml  Negative    53. Cat Hair 10,000 BAU/ml  Negative    54.  Dog Epithelia  Negative    55. Mixed Feathers  Negative    56. Horse Epithelia  Negative    57. Cockroach, German  Negative    58. Mouse  Negative    59. Tobacco Leaf  Negative     Food Perc - 01/16/19 0906    Time Antigen Placed  1779    Allergen Manufacturer  Lavella Hammock     Location  Back    Number of allergen test  10    Food  Select    1. Peanut  Negative    2. Soybean food  Negative    3. Wheat, whole  Negative    4. Sesame  Negative    5. Milk, cow  Negative    6. Egg White, chicken  Negative    7. Casein  Negative    8. Shellfish mix  Negative    9. Fish mix  Negative    10. Cashew  Negative        Allergy testing results were read and interpreted by myself, documented by clinical staff.       Salvatore Marvel, MD [facility]

## 2019-01-16 NOTE — Patient Instructions (Addendum)
1. Asthma-COPD overlap syndrome ([place]) - Lung testing looked terrible today, but it did improve with the nebulizer treatment. - Because it looked so bad today, I think we need to start a daily controller medication: Symbicort - Spacer use reviewed. - Daily controller medication(s): Symbicort 160/4.52mcg two puffs twice daily with spacer - Prior to physical activity: ProAir 2 puffs 10-15 minutes before physical activity. - Rescue medications: ProAir 4 puffs every 4-6 hours as needed or albuterol nebulizer one vial every 4-6 hours as needed - Asthma control goals:  * Full participation in all desired activities (may need albuterol before activity) * Albuterol use two time or less a week on average (not counting use with activity) * Cough interfering with sleep two time or less a month * Oral steroids no more than once a year * No hospitalizations  2. Chronic rhinitis - Testing today showed: negative to the entire panel - Copy of test results provided.  - Avoidance measures provided. - Stop taking: fluticasone - Start taking: Allegra (fexofenadine) 180mg  table once daily, Nasacort (triamcinolone) one spray per nostril daily and Astelin (azelastine) 2 sprays per nostril 1-2 times daily as needed - You can use an extra dose of the antihistamine, if needed, for breakthrough symptoms.  - Consider nasal saline rinses 1-2 times daily to remove allergens from the nasal cavities as well as help with mucous clearance (this is especially helpful to do before the nasal sprays are given) - We are going to get some labs to confirm that the testing was indeed negative.   3. Adverse food reaction - Testing was negative to the most common foods.  - There is a the low positive predictive value of food allergy testing and hence the high possibility of false positives. - In contrast, food allergy testing has a high negative predictive value, therefore if testing is negative we can be relatively assured that they  are indeed negative.  - Your symptoms did not seem consistent with a food allergy, so I believe the testing today makes sense.  4. Return in about 2 months (around 03/17/2019).   Please inform us of any Emergency Department visits, hospitalizations, or changes in symptoms. Call us before going to the ED for breathing or allergy symptoms since we might be able to fit you in for a sick visit. Feel free to contact us anytime with any questions, problems, or concerns.  It was a pleasure to meet you today!  Websites that have reliable patient information: 1. American Academy of Asthma, Allergy, and Immunology: www.aaaai.org 2. Food Allergy Research and Education (FARE): foodallergy.org 3. Mothers of Asthmatics: http://www.asthmacommunitynetwork.org 4. American College of Allergy, Asthma, and Immunology: MonthlyElectricBill.co.uk   Make sure you are registered to vote! If you have moved or changed any of your contact information, you will need to get this updated before voting!    Voter ID laws are POSSIBLY going into effect for the General Election in November 2020! Be prepared! Check out [email or web address] for more details.

## 2019-01-19 DIAGNOSIS — Z1231 Encounter for screening mammogram for malignant neoplasm of breast: Secondary | ICD-10-CM | POA: Diagnosis not present

## 2019-01-20 LAB — IGE+ALLERGENS ZONE 2(30)

## 2019-01-27 DIAGNOSIS — L84 Corns and callosities: Secondary | ICD-10-CM | POA: Diagnosis not present

## 2019-01-27 DIAGNOSIS — Z6838 Body mass index (BMI) 38.0-38.9, adult: Secondary | ICD-10-CM | POA: Diagnosis not present

## 2019-01-27 DIAGNOSIS — B372 Candidiasis of skin and nail: Secondary | ICD-10-CM | POA: Diagnosis not present

## 2019-02-16 DIAGNOSIS — H0288A Meibomian gland dysfunction right eye, upper and lower eyelids: Secondary | ICD-10-CM | POA: Diagnosis not present

## 2019-02-16 DIAGNOSIS — H52209 Unspecified astigmatism, unspecified eye: Secondary | ICD-10-CM | POA: Diagnosis not present

## 2019-02-16 DIAGNOSIS — G510 Bell's palsy: Secondary | ICD-10-CM | POA: Diagnosis not present

## 2019-02-16 DIAGNOSIS — H402233 Chronic angle-closure glaucoma, bilateral, severe stage: Secondary | ICD-10-CM | POA: Diagnosis not present

## 2019-02-16 DIAGNOSIS — J45909 Unspecified asthma, uncomplicated: Secondary | ICD-10-CM | POA: Diagnosis not present

## 2019-02-16 DIAGNOSIS — D2310 Other benign neoplasm of skin of unspecified eyelid, including canthus: Secondary | ICD-10-CM | POA: Diagnosis not present

## 2019-02-16 DIAGNOSIS — H0288B Meibomian gland dysfunction left eye, upper and lower eyelids: Secondary | ICD-10-CM | POA: Diagnosis not present

## 2019-02-16 DIAGNOSIS — Z961 Presence of intraocular lens: Secondary | ICD-10-CM | POA: Diagnosis not present

## 2019-03-25 ENCOUNTER — Ambulatory Visit (INDEPENDENT_AMBULATORY_CARE_PROVIDER_SITE_OTHER): Payer: PPO | Admitting: Allergy & Immunology

## 2019-03-25 ENCOUNTER — Encounter: Payer: Self-pay | Admitting: Allergy & Immunology

## 2019-03-25 ENCOUNTER — Other Ambulatory Visit: Payer: Self-pay

## 2019-03-25 DIAGNOSIS — J449 Chronic obstructive pulmonary disease, unspecified: Secondary | ICD-10-CM

## 2019-03-25 DIAGNOSIS — K219 Gastro-esophageal reflux disease without esophagitis: Secondary | ICD-10-CM | POA: Diagnosis not present

## 2019-03-25 DIAGNOSIS — J310 Chronic rhinitis: Secondary | ICD-10-CM | POA: Diagnosis not present

## 2019-03-25 MED ORDER — OMEPRAZOLE 20 MG PO CPDR: 20.0000 mg | DELAYED_RELEASE_CAPSULE | Freq: Every day | ORAL | 3 refills | Status: DC

## 2019-03-25 NOTE — Patient Instructions (Addendum)
1. Asthma-COPD overlap syndrome - We are going to continue with all of the medications at this point. - We will not make any medication changes at this time. - Daily controller medication(s): Symbicort 160/4.47mcg two puffs twice daily with spacer - Prior to physical activity: ProAir 2 puffs 10-15 minutes before physical activity. - Rescue medications: ProAir 4 puffs every 4-6 hours as needed or albuterol nebulizer one vial every 4-6 hours as needed - Asthma control goals:  * Full participation in all desired activities (may need albuterol before activity) * Albuterol use two time or less a week on average (not counting use with activity) * Cough interfering with sleep two time or less a month * Oral steroids no more than once a year * No hospitalizations  2. Non-allergic rhinitis - Continue taking: Allegra (fexofenadine) 180mg  table once daily, Nasacort (triamcinolone) one spray per nostril daily and Astelin (azelastine) 2 sprays per nostril 1-2 times daily as needed - You can use an extra dose of the antihistamine, if needed, for breakthrough symptoms.  - Consider nasal saline rinses 1-2 times daily to remove allergens from the nasal cavities as well as help with mucous clearance (this is especially helpful to do before the nasal sprays are given) - We may consider intradermal testing in the future.   3. Adverse food reaction - coughing with eating/drinking - Symptoms are more consistent with reflux rather than a food allergy. - Start omeprazole 20mg  daily to see if this helps with the coughing episodes.  - If there is no improvement at the next visit, we will consider additional food testing (including chocolate).   4. Return in about 4 weeks (around 04/22/2019). This can be an in-person, a virtual Webex or a telephone follow up visit.   Please inform us of any Emergency Department visits, hospitalizations, or changes in symptoms. Call us before going to the ED for breathing or allergy  symptoms since we might be able to fit you in for a sick visit. Feel free to contact us anytime with any questions, problems, or concerns.  It was a pleasure to talk to you today today!  Websites that have reliable patient information: 1. American Academy of Asthma, Allergy, and Immunology: www.aaaai.org 2. Food Allergy Research and Education (FARE): foodallergy.org 3. Mothers of Asthmatics: http://www.asthmacommunitynetwork.org 4. American College of Allergy, Asthma, and Immunology: www.acaai.org  "Like" [place] on Facebook and Instagram for our latest updates!      Make sure you are registered to vote! If you have moved or changed any of your contact information, you will need to get this updated before voting!    Voter ID laws are NOT going into effect for the General Election in November 2020! DO NOT let this stop you from exercising your right to vote!

## 2019-03-25 NOTE — Progress Notes (Signed)
Start time 10:50a Patient was at home

## 2019-03-25 NOTE — Progress Notes (Signed)
RE: Jonessa Triplett MRN: [id number] DOB: [birth year] Date of Telemedicine Visit: 03/25/2019  Referring provider: Caryl Bis, MD Primary care provider: Caryl Bis, MD  Chief Complaint: Asthma (cough, thinks that it is related to pollen ) and Allergic Rhinitis    Telemedicine Follow Up Visit via Telephone: I connected with Kawanda Drumheller for a follow up on 03/25/19 by telephone and verified that I am speaking with the correct person using two identifiers.   I discussed the limitations, risks, security and privacy concerns of performing an evaluation and management service by telephone and the availability of in person appointments. I also discussed with the patient that there may be a patient responsible charge related to this service. The patient expressed understanding and agreed to proceed.  Patient is at home accompanied by her husband who provided/contributed to the history.  Provider is at the office.  Visit start time: 10:50 AM Visit end time: 11:17 AM Insurance consent/check in by: [facility] consent and medical assistant/nurse: Lovena Le  History of Present Illness:  She is a 68 y.o. female, who is being followed for NAR and asthma-COPD overlap syndrome. Her previous allergy office visit was in February 2020 with Dr. Ernst Bowler.  At that visit, we decided to start a daily controller medication for her asthma.  We started her on Symbicort 160/4.5 mcg 2 puffs twice daily with spacer.  We also added on albuterol 2 to 4 puffs every 4-6 hours as needed.  We did do testing for environmental allergens and the entire panel was negative.  We stopped her Flonase and started Allegra 180 mg and Nasacort 1 spray per nostril daily.  We also started Astelin 2 sprays per nostril up to twice daily as needed.  She was concerned with some food allergies.  We did testing to the most common foods and these were completely negative.  Since the last visit, she has done well. She got a job at  [employer] after she visited with [place] in February. She is working at the [employer] in [place], [place]. She is able to wear a mask so she is not working closely with the customers at the [employer] She does not work there every day, but she is only working part time.   Asthma/Respiratory Symptom History: She remains on the Symbicort two puffs twice daily. She has been coughing during the daytime since the tree pollens started. She has been doing the inhaler twice daily. She does have the rescue inhaler which she has not needed to use at all.  She does feel better since starting all of her inhalers.  She has not been to the emergency room had has not needed prednisone since last visit.  Non-Allergic Rhinitis Symptom History: She is using the nasal sprays and the eye drops. She is certain that she is reacting to the pollen. She is not having a fever at all. She is using all of her nasal sprays as recommended.  She really does think that she has a reaction to the tree pollen.  However, her percutaneous testing and follow-up lab testing was completely negative for any sensitizations.  We did not do intradermal testing.  Today she reports that she she eats something, she starts coughing. This also happens when she starts drinking something as well. She does not have a history of reflux, but she does use Tums at night when her stomach hurts. She has had this problems for years but it went away for a long  period of time. She thinks that this has something to do with chocolate. We did not test her for chocolate at all.    Otherwise, there have been no changes to her past medical history, surgical history, family history, or social history.  Assessment and Plan:  Jason is a 68 y.o. female with:  Asthma-COPD overlap syndrome   Chronic non-allergic rhinitis - consider intradermal testing at the next in-person visit  Postprandial coughing - ? GERD   1. Asthma-COPD overlap syndrome ([place]) - We are  going to continue with all of the medications at this point. - We will not make any medication changes at this time. - Daily controller medication(s): Symbicort 160/4.22mcg two puffs twice daily with spacer - Prior to physical activity: ProAir 2 puffs 10-15 minutes before physical activity. - Rescue medications: ProAir 4 puffs every 4-6 hours as needed or albuterol nebulizer one vial every 4-6 hours as needed - Asthma control goals:  * Full participation in all desired activities (may need albuterol before activity) * Albuterol use two time or less a week on average (not counting use with activity) * Cough interfering with sleep two time or less a month * Oral steroids no more than once a year * No hospitalizations  2. Non-allergic rhinitis - Continue taking: Allegra (fexofenadine) 180mg  table once daily, Nasacort (triamcinolone) one spray per nostril daily and Astelin (azelastine) 2 sprays per nostril 1-2 times daily as needed - You can use an extra dose of the antihistamine, if needed, for breakthrough symptoms.  - Consider nasal saline rinses 1-2 times daily to remove allergens from the nasal cavities as well as help with mucous clearance (this is especially helpful to do before the nasal sprays are given) - We may consider intradermal testing in the future.   3. Adverse food reaction - coughing with eating/drinking - Symptoms are more consistent with reflux rather than a food allergy. - Start omeprazole 20mg  daily to see if this helps with the coughing episodes.  - If there is no improvement at the next visit, we will consider additional food testing (including chocolate).   4. Return in about 4 weeks (around 04/22/2019). This can be an in-person, a virtual Webex or a telephone follow up visit.    Diagnostics: None.  Medication List:  Current Outpatient Medications  Medication Sig Dispense Refill  . albuterol (ACCUNEB) 0.63 MG/3ML nebulizer solution Inhale into the lungs.    Marland Kitchen  albuterol (PROVENTIL HFA;VENTOLIN HFA) 108 (90 Base) MCG/ACT inhaler Inhale 2 puffs into the lungs every 6 (six) hours as needed for wheezing or shortness of breath. 1 Inhaler 1  . azelastine (ASTELIN) 0.1 % nasal spray Place 2 sprays into both nostrils 2 (two) times daily as needed for rhinitis. Use in each nostril as directed 30 mL 3  . budesonide-formoterol (SYMBICORT) 160-4.5 MCG/ACT inhaler Inhale 2 puffs into the lungs 2 (two) times daily. 1 Inhaler 5  . dorzolamide-timolol (COSOPT) 22.3-6.8 MG/ML ophthalmic solution Place 1 drop into both eyes 2 (two) times daily.  11  . erythromycin ophthalmic ointment Place into both eyes nightly.    . fexofenadine (ALLEGRA) 180 MG tablet Take 1 tablet (180 mg total) by mouth daily. 30 tablet 5  . latanoprost (XALATAN) 0.005 % ophthalmic solution 1 drop.  3  . Loratadine 10 MG CAPS Take by mouth.    . naproxen (NAPROSYN) 500 MG tablet Take by mouth.    . triamcinolone (NASACORT) 55 MCG/ACT AERO nasal inhaler Place 1 spray into the nose  daily. 16.9 mL 3  . omeprazole (PRILOSEC) 20 MG capsule Take 1 capsule (20 mg total) by mouth daily for 30 days. 30 capsule 3   No current facility-administered medications for this visit.    Allergies: Allergies  Allergen Reactions  . Atorvastatin Other (See Comments)  . Codeine Nausea And Vomiting  . Statins Other (See Comments)    Joints hurt   I reviewed her past medical history, social history, family history, and environmental history and no significant changes have been reported from previous visits.  Review of Systems  Objective:  Physical exam not obtained as encounter was done via telephone.   Previous notes and tests were reviewed.  I discussed the assessment and treatment plan with the patient. The patient was provided an opportunity to ask questions and all were answered. The patient agreed with the plan and demonstrated an understanding of the instructions.   The patient was advised to call back  or seek an in-person evaluation if the symptoms worsen or if the condition fails to improve as anticipated.  I provided 27 minutes of non-face-to-face time during this encounter.  It was my pleasure to participate in [place] care today. Please feel free to contact me with any questions or concerns.   Sincerely,  Valentina Shaggy, MD

## 2019-04-22 ENCOUNTER — Ambulatory Visit (INDEPENDENT_AMBULATORY_CARE_PROVIDER_SITE_OTHER): Payer: PPO | Admitting: Allergy & Immunology

## 2019-04-22 ENCOUNTER — Other Ambulatory Visit: Payer: Self-pay

## 2019-04-22 ENCOUNTER — Encounter: Payer: Self-pay | Admitting: Allergy & Immunology

## 2019-04-22 DIAGNOSIS — J449 Chronic obstructive pulmonary disease, unspecified: Secondary | ICD-10-CM | POA: Diagnosis not present

## 2019-04-22 DIAGNOSIS — J310 Chronic rhinitis: Secondary | ICD-10-CM | POA: Diagnosis not present

## 2019-04-22 DIAGNOSIS — K219 Gastro-esophageal reflux disease without esophagitis: Secondary | ICD-10-CM | POA: Diagnosis not present

## 2019-04-22 MED ORDER — FAMOTIDINE 40 MG PO TABS: 40.0000 mg | ORAL_TABLET | Freq: Two times a day (BID) | ORAL | 5 refills | Status: AC

## 2019-04-22 NOTE — Progress Notes (Signed)
RE: Tami Dickson MRN: [id number] DOB: [birth year] Date of Telemedicine Visit: 04/22/2019  Referring provider: Caryl Bis, MD Primary care provider: Caryl Bis, MD  Chief Complaint: Follow-up   Telemedicine Follow Up Visit via Telephone: I connected with Charmayne Odell for a follow up on 04/22/19 by telephone and verified that I am speaking with the correct person using two identifiers.   I discussed the limitations, risks, security and privacy concerns of performing an evaluation and management service by telephone and the availability of in person appointments. I also discussed with the patient that there may be a patient responsible charge related to this service. The patient expressed understanding and agreed to proceed.  Patient is at home accompanied by who provided/contributed to the history.  Provider is at the office.  Visit start time: 10:25 AM Visit end time: 11:05 AM Insurance consent/check in by: [facility] consent and medical assistant/nurse: Daisy Lazar  History of Present Illness:  She is a 68 y.o. female, who is being followed for asthma COPD overlap syndrome, nonallergic rhinitis, and. Her previous allergy office visit was in April 2020 with Dr. Ernst Bowler. She was last seen in April 2020 over the phone. She was continued on the Symbicort as well as the albuterol as needed. For her NAR, we continued Allegra as well as Nasacort and Astelin.   Since the last visit, she has done well. She is no longer coughing with the use of the omeprazole. She is concerned with the bone mineralization side effects. She is going to have a bone density test in the near future. She is worried about the side effects and would like to address these concerns today.   She does check her temperature nearly daily to make sure that she is not running a fever.  She has been sheltering in place and has been around no one who has been ill.  Asthma/Respiratory Symptom History: She does  remain on the Symbicort two puffs twice daily. She has been doing it twice daily as directed. She has used her rescue inhaler twice or so. She has not needed to go to the hospital since the last visit. She has not needed prednisone at all.   Non-Allergic Rhinitis Symptom History: She remains on the nasal sprays from the last visit. She is not great about using the nose sprays. She was using it all of the time before, but her nose is not running like it was in the past. She has not need taking the Allegra at all and it seems that her rhinorrhea has become less of an issue with the omeprazole. She does report some throat clearing fairly frequently.   She otherwise spends quite a bit of time discussing her cats.  She has 5 in total she also has a friend who recently passed away secondary to side effects of a "sugar drug".  Otherwise, there have been no changes to her past medical history, surgical history, family history, or social history.  Assessment and Plan:  Dorine is a 68 y.o. female with:  Asthma-COPD overlap syndrome   Chronic non-allergic rhinitis - consider intradermal testing at the next in-person visit  Postprandial coughing - ? GERD   1. Asthma-COPD overlap syndrome - We are not going to make any medication changes at this time.  - We may consider changing to the lower dose Symbicort at the next visit since things have been so well-controlled.  - Daily controller medication(s): Symbicort 160/4.58mcg two puffs twice daily with spacer -  Prior to physical activity: ProAir 2 puffs 10-15 minutes before physical activity. - Rescue medications: ProAir 4 puffs every 4-6 hours as needed or albuterol nebulizer one vial every 4-6 hours as needed - Asthma control goals:  * Full participation in all desired activities (may need albuterol before activity) * Albuterol use two time or less a week on average (not counting use with activity) * Cough interfering with sleep two time or less a month  * Oral steroids no more than once a year * No hospitalizations  2. Non-allergic rhinitis - Continue taking: Nasacort (triamcinolone) one spray per nostril daily and Astelin (azelastine) 2 sprays per nostril 1-2 times daily as needed - We do not need to add on an antihistamine at this time since the throat clearing has improved.  - You can use an extra dose of the antihistamine, if needed, for breakthrough symptoms.  - Consider nasal saline rinses 1-2 times daily to remove allergens from the nasal cavities as well as help with mucous clearance (this is especially helpful to do before the nasal sprays are given) - We will do intradermal testing at the next visit.   3. GERD - better controlled with the omeprazole - Stop the omeprazole and start famotidine 40mg  twice daily. - The famotidine works in a different way to help with the reflux symptoms and does not have the bone mineralization side effects.  - I would like to see the results of the bone density test when you get that done.   4. Return in about 3 months (around 07/23/2019) for INTRADERMAL TESTING. This can be an in-person, a virtual Webex or a telephone follow up visit.  Diagnostics: None.  Medication List:  Current Outpatient Medications  Medication Sig Dispense Refill  . albuterol (ACCUNEB) 0.63 MG/3ML nebulizer solution Inhale into the lungs.    Marland Kitchen albuterol (PROVENTIL HFA;VENTOLIN HFA) 108 (90 Base) MCG/ACT inhaler Inhale 2 puffs into the lungs every 6 (six) hours as needed for wheezing or shortness of breath. 1 Inhaler 1  . azelastine (ASTELIN) 0.1 % nasal spray Place 2 sprays into both nostrils 2 (two) times daily as needed for rhinitis. Use in each nostril as directed 30 mL 3  . budesonide-formoterol (SYMBICORT) 160-4.5 MCG/ACT inhaler Inhale 2 puffs into the lungs 2 (two) times daily. 1 Inhaler 5  . dorzolamide-timolol (COSOPT) 22.3-6.8 MG/ML ophthalmic solution Place 1 drop into both eyes 2 (two) times daily.  11  .  erythromycin ophthalmic ointment Place into both eyes nightly.    . fexofenadine (ALLEGRA) 180 MG tablet Take 1 tablet (180 mg total) by mouth daily. 30 tablet 5  . latanoprost (XALATAN) 0.005 % ophthalmic solution 1 drop.  3  . Loratadine 10 MG CAPS Take by mouth.    Marland Kitchen omeprazole (PRILOSEC) 20 MG capsule Take 1 capsule (20 mg total) by mouth daily for 30 days. 30 capsule 3  . triamcinolone (NASACORT) 55 MCG/ACT AERO nasal inhaler Place 1 spray into the nose daily. 16.9 mL 3  . famotidine (PEPCID) 40 MG tablet Take 1 tablet (40 mg total) by mouth 2 (two) times a day. 60 tablet 5   No current facility-administered medications for this visit.    Allergies: Allergies  Allergen Reactions  . Atorvastatin Other (See Comments)  . Codeine Nausea And Vomiting  . Statins Other (See Comments)    Joints hurt   I reviewed her past medical history, social history, family history, and environmental history and no significant changes have been reported from previous  visits.  Review of Systems  Constitutional: Negative for activity change, appetite change, chills, fatigue and fever.  HENT: Negative for congestion, postnasal drip, rhinorrhea, sinus pressure and sore throat.   Eyes: Negative for pain, discharge, redness and itching.  Respiratory: Negative for shortness of breath, wheezing and stridor.   Gastrointestinal: Negative for abdominal pain, constipation, diarrhea, nausea and vomiting.  Endocrine: Negative for cold intolerance and heat intolerance.  Musculoskeletal: Negative for arthralgias, joint swelling and myalgias.  Skin: Negative for rash.  Allergic/Immunologic: Negative for environmental allergies, food allergies and immunocompromised state.    Objective:  Physical exam not obtained as encounter was done via telephone.   Previous notes and tests were reviewed.  I discussed the assessment and treatment plan with the patient. The patient was provided an opportunity to ask questions  and all were answered. The patient agreed with the plan and demonstrated an understanding of the instructions.   The patient was advised to call back or seek an in-person evaluation if the symptoms worsen or if the condition fails to improve as anticipated.  I provided 30 minutes of non-face-to-face time during this encounter.  It was my pleasure to participate in [place] care today. Please feel free to contact me with any questions or concerns.   Sincerely,  Valentina Shaggy, MD

## 2019-04-22 NOTE — Patient Instructions (Addendum)
1. Asthma-COPD overlap syndrome - We are not going to make any medication changes at this time.  - We may consider changing to the lower dose Symbicort at the next visit since things have been so well-controlled.  - Daily controller medication(s): Symbicort 160/4.2mcg two puffs twice daily with spacer - Prior to physical activity: ProAir 2 puffs 10-15 minutes before physical activity. - Rescue medications: ProAir 4 puffs every 4-6 hours as needed or albuterol nebulizer one vial every 4-6 hours as needed - Asthma control goals:  * Full participation in all desired activities (may need albuterol before activity) * Albuterol use two time or less a week on average (not counting use with activity) * Cough interfering with sleep two time or less a month * Oral steroids no more than once a year * No hospitalizations  2. Non-allergic rhinitis - Continue taking: Nasacort (triamcinolone) one spray per nostril daily and Astelin (azelastine) 2 sprays per nostril 1-2 times daily as needed - We do not need to add on an antihistamine at this time since the throat clearing has improved.  - You can use an extra dose of the antihistamine, if needed, for breakthrough symptoms.  - Consider nasal saline rinses 1-2 times daily to remove allergens from the nasal cavities as well as help with mucous clearance (this is especially helpful to do before the nasal sprays are given) - We will do intradermal testing at the next visit.   3. GERD - better controlled with the omeprazole - Stop the omeprazole and start famotidine 40mg  twice daily. - The famotidine works in a different way to help with the reflux symptoms and does not have the bone mineralization side effects.  - I would like to see the results of the bone density test when you get that done.   4. Return in about 3 months (around 07/23/2019) for INTRADERMAL TESTING. This can be an in-person, a virtual Webex or a telephone follow up visit.   Please inform us  of any Emergency Department visits, hospitalizations, or changes in symptoms. Call us before going to the ED for breathing or allergy symptoms since we might be able to fit you in for a sick visit. Feel free to contact us anytime with any questions, problems, or concerns.  It was a pleasure to talk to you today today!  Websites that have reliable patient information: 1. American Academy of Asthma, Allergy, and Immunology: www.aaaai.org 2. Food Allergy Research and Education (FARE): foodallergy.org 3. Mothers of Asthmatics: http://www.asthmacommunitynetwork.org 4. American College of Allergy, Asthma, and Immunology: www.acaai.org  "Like" [place] on Facebook and Instagram for our latest updates!      Make sure you are registered to vote! If you have moved or changed any of your contact information, you will need to get this updated before voting!    Voter ID laws are NOT going into effect for the General Election in November 2020! DO NOT let this stop you from exercising your right to vote!

## 2019-06-19 DIAGNOSIS — K210 Gastro-esophageal reflux disease with esophagitis: Secondary | ICD-10-CM | POA: Diagnosis not present

## 2019-06-19 DIAGNOSIS — I10 Essential (primary) hypertension: Secondary | ICD-10-CM | POA: Diagnosis not present

## 2019-06-19 DIAGNOSIS — R5383 Other fatigue: Secondary | ICD-10-CM | POA: Diagnosis not present

## 2019-06-19 DIAGNOSIS — E782 Mixed hyperlipidemia: Secondary | ICD-10-CM | POA: Diagnosis not present

## 2019-06-26 DIAGNOSIS — Z0000 Encounter for general adult medical examination without abnormal findings: Secondary | ICD-10-CM | POA: Diagnosis not present

## 2019-06-26 DIAGNOSIS — M545 Low back pain: Secondary | ICD-10-CM | POA: Diagnosis not present

## 2019-06-26 DIAGNOSIS — M25552 Pain in left hip: Secondary | ICD-10-CM | POA: Diagnosis not present

## 2019-06-26 DIAGNOSIS — K219 Gastro-esophageal reflux disease without esophagitis: Secondary | ICD-10-CM | POA: Diagnosis not present

## 2019-06-26 DIAGNOSIS — R05 Cough: Secondary | ICD-10-CM | POA: Diagnosis not present

## 2019-06-26 DIAGNOSIS — G5602 Carpal tunnel syndrome, left upper limb: Secondary | ICD-10-CM | POA: Diagnosis not present

## 2019-06-26 DIAGNOSIS — Z6837 Body mass index (BMI) 37.0-37.9, adult: Secondary | ICD-10-CM | POA: Diagnosis not present

## 2019-06-26 DIAGNOSIS — E782 Mixed hyperlipidemia: Secondary | ICD-10-CM | POA: Diagnosis not present

## 2019-07-20 ENCOUNTER — Other Ambulatory Visit: Payer: Self-pay | Admitting: Allergy & Immunology

## 2019-07-24 ENCOUNTER — Ambulatory Visit: Payer: PPO | Admitting: Allergy & Immunology

## 2019-08-21 ENCOUNTER — Ambulatory Visit (INDEPENDENT_AMBULATORY_CARE_PROVIDER_SITE_OTHER): Payer: PPO | Admitting: Allergy & Immunology

## 2019-08-21 ENCOUNTER — Other Ambulatory Visit: Payer: Self-pay

## 2019-08-21 ENCOUNTER — Encounter: Payer: Self-pay | Admitting: Allergy & Immunology

## 2019-08-21 VITALS — BP 150/72 | HR 73 | Temp 98.3°F | Resp 18

## 2019-08-21 DIAGNOSIS — J449 Chronic obstructive pulmonary disease, unspecified: Secondary | ICD-10-CM | POA: Diagnosis not present

## 2019-08-21 DIAGNOSIS — K219 Gastro-esophageal reflux disease without esophagitis: Secondary | ICD-10-CM

## 2019-08-21 DIAGNOSIS — J302 Other seasonal allergic rhinitis: Secondary | ICD-10-CM | POA: Diagnosis not present

## 2019-08-21 DIAGNOSIS — J3089 Other allergic rhinitis: Secondary | ICD-10-CM

## 2019-08-21 MED ORDER — SYMBICORT 160-4.5 MCG/ACT IN AERO: 2.0000 | INHALATION_SPRAY | Freq: Two times a day (BID) | RESPIRATORY_TRACT | 5 refills | Status: AC

## 2019-08-21 MED ORDER — TRIAMCINOLONE ACETONIDE 55 MCG/ACT NA AERO: 1.0000 | INHALATION_SPRAY | Freq: Every day | NASAL | 3 refills | Status: DC

## 2019-08-21 NOTE — Progress Notes (Signed)
FOLLOW UP  Date of Service/Encounter:  08/21/19   Assessment:   Seasonal and perennial allergic rhinitis (grasses, weeds, indoor molds, outdoor molds, and dust mite)  Asthma-COPD overlap syndrome  Gastroesophageal reflux disease  Plan/Recommendations:   1. Asthma-COPD overlap syndrome - We may consider changing to the lower dose Symbicort at the next visit since things have been so well-controlled.  - Daily controller medication(s): Symbicort 160/4.54mcg two puffs twice daily with spacer - Prior to physical activity: ProAir 2 puffs 10-15 minutes before physical activity. - Rescue medications: ProAir 4 puffs every 4-6 hours as needed or albuterol nebulizer one vial every 4-6 hours as needed - Asthma control goals:  * Full participation in all desired activities (may need albuterol before activity) * Albuterol use two time or less a week on average (not counting use with activity) * Cough interfering with sleep two time or less a month * Oral steroids no more than once a year * No hospitalizations  2. Perennial and seasonal allergic rhinitis  - Testing was positive to grasses, weeds, indoor molds, outdoor molds, and dust mite - Copy of test results provided. - Avoidance measures provided. - Continue taking: Nasacort (triamcinolone) one spray per nostril daily and Astelin (azelastine) 2 sprays per nostril 1-2 times daily as needed - Consider all shots for long term control (information provided). - This would help both her rhinitis symptoms as well as her allergic asthma.   3. GERD - Continue with famotidine 40mg  twice daily.  4. Return in about 3 months (around 11/20/2019). This can be an in-person, a virtual Webex or a telephone follow up visit.    Subjective:   Tami Dickson is a 68 y.o. female presenting today for follow up of No chief complaint on file.   Tami Dickson has a history of the following: Patient Active Problem List   Diagnosis Date  Noted  . Asthma-COPD overlap syndrome ([place]) 01/16/2019  . Chronic rhinitis 01/16/2019    History obtained from: chart review and patient.  Selin is a 68 y.o. female presenting for skin testing. She was last seen in May 2020 as a telephone visit. At that time, we did not make any medication changes. We continued Symbicort 160/4.5 two puffs twice daily with albuterol as needed.  For her nonallergic rhinitis, we continue Nasacort and Astelin.  She continued to endorse symptoms with allergic triggers, so we decided to schedule her for intradermal testing.  Previously her prick testing and her blood testing have been negative.  Her reflux was controlled with the omeprazole.  However, she was concerned with bone mineralization so we changed her to famotidine.  She was already scheduled for a bone density exam by her primary care provider.  Since last visit, she has done well.  Unfortunately, she forgot about the appointment today and is about an hour late.  However, she reports no new changes to her medical history.  Her asthma has been well controlled with the use of Symbicort.  She has not needed her rescue inhaler in several months.  She is using her nasal sprays, but notes that they sometimes make her runny nose even worse than before. She apparently had an incident when she was working at [employer] when she leaned over to grab something from the floor and her nose started running like a faucet.  She also thinks a lot of her rhinitis has to do with some eyedrops she is on to decrease the pressures within her eyes.  Her ophthalmologist is already make any changes because it is doing so well with reducing her ocular pressures.  Otherwise, there have been no changes to her past medical history, surgical history, family history, or social history.    Review of Systems  Constitutional: Negative.  Negative for fever, malaise/fatigue and weight loss.  HENT: Negative.  Negative for congestion, ear discharge  and ear pain.        Positive for rhinorrhea.   Eyes: Negative for pain, discharge and redness.  Respiratory: Negative for cough, sputum production, shortness of breath and wheezing.   Cardiovascular: Negative.  Negative for chest pain and palpitations.  Gastrointestinal: Negative for abdominal pain, constipation, diarrhea, heartburn, nausea and vomiting.  Skin: Negative.  Negative for itching and rash.  Neurological: Negative for dizziness and headaches.  Endo/Heme/Allergies: Negative for environmental allergies. Does not bruise/bleed easily.       Objective:   Blood pressure (!) 150/72, pulse 73, temperature 98.3 F (36.8 C), temperature source Oral, resp. rate 18, SpO2 98 %. There is no height or weight on file to calculate BMI.   Physical Exam:  Physical Exam  Constitutional: She appears well-developed.  Very talkative female.   HENT:  Head: Normocephalic and atraumatic.  Right Ear: Tympanic membrane, external ear and ear canal normal.  Left Ear: Tympanic membrane, external ear and ear canal normal.  Nose: Mucosal edema and rhinorrhea present. No nasal deformity or septal deviation. No epistaxis. Right sinus exhibits no maxillary sinus tenderness and no frontal sinus tenderness. Left sinus exhibits no maxillary sinus tenderness and no frontal sinus tenderness.  Mouth/Throat: Uvula is midline and oropharynx is clear and moist. Mucous membranes are not pale and not dry.  Mild cobblestoning present in the posterior oropharynx.   Eyes: Pupils are equal, round, and reactive to light. Conjunctivae and EOM are normal. Right eye exhibits no chemosis and no discharge. Left eye exhibits no chemosis and no discharge. Right conjunctiva is not injected. Left conjunctiva is not injected.  Cardiovascular: Normal rate, regular rhythm and normal heart sounds.  Respiratory: Effort normal and breath sounds normal. No accessory muscle usage. No tachypnea. No respiratory distress. She has no  wheezes. She has no rhonchi. She has no rales. She exhibits no tenderness.  Moving air well in all lung fields.   Lymphadenopathy:    She has no cervical adenopathy.  Neurological: She is alert.  Skin: No abrasion, no petechiae and no rash noted. Rash is not papular, not vesicular and not urticarial. No erythema. No pallor.  No eczematous or urticarial lesions noted.   Psychiatric: She has a normal mood and affect.     Diagnostic studies:     Allergy Studies:    Intradermal - 08/21/19 1057    Time Antigen Placed  1045    Allergen Manufacturer  Greer    Location  Arm    Number of Test  15    Intradermal  Select    Control  Negative    [place]  2+    [place]  Negative    7 Grass  1+    Ragweed mix  Negative    Weed mix  2+    Tree mix  Negative    Mold 1  Negative    Mold 2  Negative    Mold 3  3+    Mold 4  3+    Cat  Negative    Dog  Negative    Cockroach  Negative    Mite mix  1+       Allergy testing results were read and interpreted by myself, documented by clinical staff.      Salvatore Marvel, MD  [facility]

## 2019-08-21 NOTE — Patient Instructions (Addendum)
1. Asthma-COPD overlap syndrome - We may consider changing to the lower dose Symbicort at the next visit since things have been so well-controlled.  - Daily controller medication(s): Symbicort 160/4.29mcg two puffs twice daily with spacer - Prior to physical activity: ProAir 2 puffs 10-15 minutes before physical activity. - Rescue medications: ProAir 4 puffs every 4-6 hours as needed or albuterol nebulizer one vial every 4-6 hours as needed - Asthma control goals:  * Full participation in all desired activities (may need albuterol before activity) * Albuterol use two time or less a week on average (not counting use with activity) * Cough interfering with sleep two time or less a month * Oral steroids no more than once a year * No hospitalizations  2. Perennial and seasonal allergic rhinitis - Testing was positive to grasses, weeds, indoor molds, outdoor molds, and dust mite - Copy of test results provided. - Avoidance measures provided. - Continue taking: Nasacort (triamcinolone) one spray per nostril daily and Astelin (azelastine) 2 sprays per nostril 1-2 times daily as needed - Consider all shots for long term control (information provided)  3. GERD - Continue with famotidine 40mg  twice daily.  4. Return in about 3 months (around 11/20/2019). This can be an in-person, a virtual Webex or a telephone follow up visit.   Please inform us of any Emergency Department visits, hospitalizations, or changes in symptoms. Call us before going to the ED for breathing or allergy symptoms since we might be able to fit you in for a sick visit. Feel free to contact us anytime with any questions, problems, or concerns.  It was a pleasure to talk to you today today!  Websites that have reliable patient information: 1. American Academy of Asthma, Allergy, and Immunology: www.aaaai.org 2. Food Allergy Research and Education (FARE): foodallergy.org 3. Mothers of Asthmatics:  http://www.asthmacommunitynetwork.org 4. American College of Allergy, Asthma, and Immunology: www.acaai.org  Like [place] on [place] and Instagram for our latest updates!      Make sure you are registered to vote! If you have moved or changed any of your contact information, you will need to get this updated before voting!    Voter ID laws are NOT going into effect for the General Election in November 2020! DO NOT let this stop you from exercising your right to vote!    Reducing Pollen Exposure  The American Academy of Allergy, Asthma and Immunology suggests the following steps to reduce your exposure to pollen during allergy seasons.    1. Do not hang sheets or clothing out to dry; pollen may collect on these items. 2. Do not mow lawns or spend time around freshly cut grass; mowing stirs up pollen. 3. Keep windows closed at night.  Keep car windows closed while driving. 4. Minimize morning activities outdoors, a time when pollen counts are usually at their highest. 5. Stay indoors as much as possible when pollen counts or humidity is high and on [place] days when pollen tends to remain in the air longer. 6. Use air conditioning when possible.  Many air conditioners have filters that trap the pollen spores. 7. Use a HEPA room air filter to remove pollen form the indoor air you breathe.  Control of Mold Allergen   Mold and fungi can grow on a variety of surfaces provided certain temperature and moisture conditions exist.  Outdoor molds grow on plants, decaying vegetation and soil.  The major outdoor mold, Alternaria and Cladosporium, are found in very high numbers during hot and  dry conditions.  Generally, a late Summer - Fall peak is seen for common outdoor fungal spores.  Rain will temporarily lower outdoor mold spore count, but counts rise rapidly when the rainy period ends.  The most important indoor molds are Aspergillus and Penicillium.  Dark, humid and poorly ventilated basements are  ideal sites for mold growth.  The next most common sites of mold growth are the bathroom and the kitchen.  Outdoor (Seasonal) Mold Control  Positive outdoor molds via skin testing: Bipolaris (Helminthsporium), Drechslera (Curvalaria) and Mucor  1. Use air conditioning and keep windows closed 2. Avoid exposure to decaying vegetation. 3. Avoid leaf raking. 4. Avoid grain handling. 5. Consider wearing a face mask if working in moldy areas.  6.   Indoor (Perennial) Mold Control   Positive indoor molds via skin testing: Fusarium, Aureobasidium (Pullulara) and Rhizopus  1. Maintain humidity below 50%. 2. Clean washable surfaces with 5% bleach solution. 3. Remove sources e.g. contaminated carpets.     Control of House Dust Mite Allergen    House dust mites play a major role in allergic asthma and rhinitis.  They occur in environments with high humidity wherever human skin, the food for dust mites is found. High levels have been detected in dust obtained from mattresses, pillows, carpets, upholstered furniture, bed covers, clothes and soft toys.  The principal allergen of the house dust mite is found in its feces.  A gram of dust may contain 1,000 mites and 250,000 fecal particles.  Mite antigen is easily measured in the air during house cleaning activities.    1. Encase mattresses, including the box spring, and pillow, in an air tight cover.  Seal the zipper end of the encased mattresses with wide adhesive tape. 2. Wash the bedding in water of 130 degrees Farenheit weekly.  Avoid cotton comforters/quilts and flannel bedding: the most ideal bed covering is the dacron comforter. 3. Remove all upholstered furniture from the bedroom. 4. Remove carpets, carpet padding, rugs, and non-washable window drapes from the bedroom.  Wash drapes weekly or use plastic window coverings. 5. Remove all non-washable stuffed toys from the bedroom.  Wash stuffed toys weekly. 6. Have the room cleaned  frequently with a vacuum cleaner and a damp dust-mop.  The patient should not be in a room which is being cleaned and should wait 1 hour after cleaning before going into the room. 7. Close and seal all heating outlets in the bedroom.  Otherwise, the room will become filled with dust-laden air.  An electric heater can be used to heat the room. 8. Reduce indoor humidity to less than 50%.  Do not use a humidifier.  Allergy Shots   Allergies are the result of a chain reaction that starts in the immune system. Your immune system controls how your body defends itself. For instance, if you have an allergy to pollen, your immune system identifies pollen as an invader or allergen. Your immune system overreacts by producing antibodies called Immunoglobulin E (IgE). These antibodies travel to cells that release chemicals, causing an allergic reaction.  The concept behind allergy immunotherapy, whether it is received in the form of shots or tablets, is that the immune system can be desensitized to specific allergens that trigger allergy symptoms. Although it requires time and patience, the payback can be long-term relief.  How Do Allergy Shots Work?  Allergy shots work much like a vaccine. Your body responds to injected amounts of a particular allergen given in increasing doses, eventually developing  a resistance and tolerance to it. Allergy shots can lead to decreased, minimal or no allergy symptoms.  There generally are two phases: build-up and maintenance. Build-up often ranges from three to six months and involves receiving injections with increasing amounts of the allergens. The shots are typically given once or twice a week, though more rapid build-up schedules are sometimes used.  The maintenance phase begins when the most effective dose is reached. This dose is different for each person, depending on how allergic you are and your response to the build-up injections. Once the maintenance dose is reached,  there are longer periods between injections, typically two to four weeks.  Occasionally doctors give cortisone-type shots that can temporarily reduce allergy symptoms. These types of shots are different and should not be confused with allergy immunotherapy shots.  Who Can Be Treated with Allergy Shots?  Allergy shots may be a good treatment approach for people with allergic rhinitis (hay fever), allergic asthma, conjunctivitis (eye allergy) or stinging insect allergy.   Before deciding to begin allergy shots, you should consider:   The length of allergy season and the severity of your symptoms  Whether medications and/or changes to your environment can control your symptoms  Your desire to avoid long-term medication use  Time: allergy immunotherapy requires a major time commitment  Cost: may vary depending on your insurance coverage  Allergy shots for children age 8 and older are effective and often well tolerated. They might prevent the onset of new allergen sensitivities or the progression to asthma.  Allergy shots are not started on patients who are pregnant but can be continued on patients who become pregnant while receiving them. In some patients with other medical conditions or who take certain common medications, allergy shots may be of risk. It is important to mention other medications you talk to your allergist.   When Will I Feel Better?  Some may experience decreased allergy symptoms during the build-up phase. For others, it may take as long as 12 months on the maintenance dose. If there is no improvement after a year of maintenance, your allergist will discuss other treatment options with you.  If you arent responding to allergy shots, it may be because there is not enough dose of the allergen in your vaccine or there are missing allergens that were not identified during your allergy testing. Other reasons could be that there are high levels of the allergen in your  environment or major exposure to non-allergic triggers like tobacco smoke.  What Is the Length of Treatment?  Once the maintenance dose is reached, allergy shots are generally continued for three to five years. The decision to stop should be discussed with your allergist at that time. Some people may experience a permanent reduction of allergy symptoms. Others may relapse and a longer course of allergy shots can be considered.  What Are the Possible Reactions?  The two types of adverse reactions that can occur with allergy shots are local and systemic. Common local reactions include very mild redness and swelling at the injection site, which can happen immediately or several hours after. A systemic reaction, which is less common, affects the entire body or a particular body system. They are usually mild and typically respond quickly to medications. Signs include increased allergy symptoms such as sneezing, a stuffy nose or hives.  Rarely, a serious systemic reaction called anaphylaxis can develop. Symptoms include swelling in the throat, wheezing, a feeling of tightness in the chest, nausea or dizziness. Most  serious systemic reactions develop within 30 minutes of allergy shots. This is why it is strongly recommended you wait in your doctors office for 30 minutes after your injections. Your allergist is trained to watch for reactions, and his or her staff is trained and equipped with the proper medications to identify and treat them.  Who Should Administer Allergy Shots?  The preferred location for receiving shots is your prescribing allergists office. Injections can sometimes be given at another facility where the physician and staff are trained to recognize and treat reactions, and have received instructions by your prescribing allergist.

## 2019-08-31 DIAGNOSIS — Z9842 Cataract extraction status, left eye: Secondary | ICD-10-CM | POA: Diagnosis not present

## 2019-08-31 DIAGNOSIS — H52209 Unspecified astigmatism, unspecified eye: Secondary | ICD-10-CM | POA: Diagnosis not present

## 2019-08-31 DIAGNOSIS — Z9841 Cataract extraction status, right eye: Secondary | ICD-10-CM | POA: Diagnosis not present

## 2019-08-31 DIAGNOSIS — J45909 Unspecified asthma, uncomplicated: Secondary | ICD-10-CM | POA: Diagnosis not present

## 2019-08-31 DIAGNOSIS — Z961 Presence of intraocular lens: Secondary | ICD-10-CM | POA: Diagnosis not present

## 2019-08-31 DIAGNOSIS — D2310 Other benign neoplasm of skin of unspecified eyelid, including canthus: Secondary | ICD-10-CM | POA: Diagnosis not present

## 2019-08-31 DIAGNOSIS — H1013 Acute atopic conjunctivitis, bilateral: Secondary | ICD-10-CM | POA: Diagnosis not present

## 2019-08-31 DIAGNOSIS — D3132 Benign neoplasm of left choroid: Secondary | ICD-10-CM | POA: Diagnosis not present

## 2019-08-31 DIAGNOSIS — H0288A Meibomian gland dysfunction right eye, upper and lower eyelids: Secondary | ICD-10-CM | POA: Diagnosis not present

## 2019-08-31 DIAGNOSIS — G510 Bell's palsy: Secondary | ICD-10-CM | POA: Diagnosis not present

## 2019-08-31 DIAGNOSIS — H402233 Chronic angle-closure glaucoma, bilateral, severe stage: Secondary | ICD-10-CM | POA: Diagnosis not present

## 2019-08-31 DIAGNOSIS — H0288B Meibomian gland dysfunction left eye, upper and lower eyelids: Secondary | ICD-10-CM | POA: Diagnosis not present

## 2019-09-22 DIAGNOSIS — J019 Acute sinusitis, unspecified: Secondary | ICD-10-CM | POA: Diagnosis not present

## 2019-09-22 DIAGNOSIS — Z03818 Encounter for observation for suspected exposure to other biological agents ruled out: Secondary | ICD-10-CM | POA: Diagnosis not present

## 2019-10-14 DIAGNOSIS — Z6835 Body mass index (BMI) 35.0-35.9, adult: Secondary | ICD-10-CM | POA: Diagnosis not present

## 2019-10-14 DIAGNOSIS — M545 Low back pain: Secondary | ICD-10-CM | POA: Diagnosis not present

## 2019-10-28 DIAGNOSIS — M545 Low back pain: Secondary | ICD-10-CM | POA: Diagnosis not present

## 2019-10-28 DIAGNOSIS — R05 Cough: Secondary | ICD-10-CM | POA: Diagnosis not present

## 2019-10-28 DIAGNOSIS — Z20828 Contact with and (suspected) exposure to other viral communicable diseases: Secondary | ICD-10-CM | POA: Diagnosis not present

## 2019-10-28 DIAGNOSIS — M47819 Spondylosis without myelopathy or radiculopathy, site unspecified: Secondary | ICD-10-CM | POA: Diagnosis not present

## 2019-11-20 ENCOUNTER — Ambulatory Visit: Payer: PPO | Admitting: Allergy & Immunology

## 2019-12-21 DIAGNOSIS — K219 Gastro-esophageal reflux disease without esophagitis: Secondary | ICD-10-CM | POA: Diagnosis not present

## 2019-12-21 DIAGNOSIS — E782 Mixed hyperlipidemia: Secondary | ICD-10-CM | POA: Diagnosis not present

## 2019-12-21 DIAGNOSIS — I10 Essential (primary) hypertension: Secondary | ICD-10-CM | POA: Diagnosis not present

## 2019-12-24 DIAGNOSIS — G5602 Carpal tunnel syndrome, left upper limb: Secondary | ICD-10-CM | POA: Diagnosis not present

## 2019-12-24 DIAGNOSIS — R05 Cough: Secondary | ICD-10-CM | POA: Diagnosis not present

## 2019-12-24 DIAGNOSIS — E782 Mixed hyperlipidemia: Secondary | ICD-10-CM | POA: Diagnosis not present

## 2019-12-24 DIAGNOSIS — M25552 Pain in left hip: Secondary | ICD-10-CM | POA: Diagnosis not present

## 2019-12-24 DIAGNOSIS — K219 Gastro-esophageal reflux disease without esophagitis: Secondary | ICD-10-CM | POA: Diagnosis not present

## 2019-12-24 DIAGNOSIS — Z0001 Encounter for general adult medical examination with abnormal findings: Secondary | ICD-10-CM | POA: Diagnosis not present

## 2019-12-24 DIAGNOSIS — Z6836 Body mass index (BMI) 36.0-36.9, adult: Secondary | ICD-10-CM | POA: Diagnosis not present

## 2019-12-24 DIAGNOSIS — Z1212 Encounter for screening for malignant neoplasm of rectum: Secondary | ICD-10-CM | POA: Diagnosis not present

## 2020-02-26 DIAGNOSIS — Z1231 Encounter for screening mammogram for malignant neoplasm of breast: Secondary | ICD-10-CM | POA: Diagnosis not present

## 2020-03-30 DIAGNOSIS — H402233 Chronic angle-closure glaucoma, bilateral, severe stage: Secondary | ICD-10-CM | POA: Diagnosis not present

## 2020-03-30 DIAGNOSIS — D23121 Other benign neoplasm of skin of left upper eyelid, including canthus: Secondary | ICD-10-CM | POA: Diagnosis not present

## 2020-03-30 DIAGNOSIS — Z961 Presence of intraocular lens: Secondary | ICD-10-CM | POA: Diagnosis not present

## 2020-03-30 DIAGNOSIS — D2310 Other benign neoplasm of skin of unspecified eyelid, including canthus: Secondary | ICD-10-CM | POA: Diagnosis not present

## 2020-03-30 DIAGNOSIS — H0288B Meibomian gland dysfunction left eye, upper and lower eyelids: Secondary | ICD-10-CM | POA: Diagnosis not present

## 2020-03-30 DIAGNOSIS — H0288A Meibomian gland dysfunction right eye, upper and lower eyelids: Secondary | ICD-10-CM | POA: Diagnosis not present

## 2020-03-30 DIAGNOSIS — H53462 Homonymous bilateral field defects, left side: Secondary | ICD-10-CM | POA: Diagnosis not present

## 2020-03-30 DIAGNOSIS — G510 Bell's palsy: Secondary | ICD-10-CM | POA: Diagnosis not present

## 2020-04-06 DIAGNOSIS — L02212 Cutaneous abscess of back [any part, except buttock]: Secondary | ICD-10-CM | POA: Diagnosis not present

## 2020-04-06 DIAGNOSIS — Z6835 Body mass index (BMI) 35.0-35.9, adult: Secondary | ICD-10-CM | POA: Diagnosis not present

## 2020-04-07 DIAGNOSIS — H402233 Chronic angle-closure glaucoma, bilateral, severe stage: Secondary | ICD-10-CM | POA: Diagnosis not present

## 2020-04-07 DIAGNOSIS — H53462 Homonymous bilateral field defects, left side: Secondary | ICD-10-CM | POA: Diagnosis not present

## 2020-05-19 ENCOUNTER — Other Ambulatory Visit: Payer: Self-pay | Admitting: Orthopedic Surgery

## 2020-06-07 DIAGNOSIS — K7689 Other specified diseases of liver: Secondary | ICD-10-CM | POA: Diagnosis not present

## 2020-06-07 DIAGNOSIS — K219 Gastro-esophageal reflux disease without esophagitis: Secondary | ICD-10-CM | POA: Diagnosis not present

## 2020-06-07 DIAGNOSIS — E782 Mixed hyperlipidemia: Secondary | ICD-10-CM | POA: Diagnosis not present

## 2020-06-14 DIAGNOSIS — Z0001 Encounter for general adult medical examination with abnormal findings: Secondary | ICD-10-CM | POA: Diagnosis not present

## 2020-06-14 DIAGNOSIS — M25552 Pain in left hip: Secondary | ICD-10-CM | POA: Diagnosis not present

## 2020-06-14 DIAGNOSIS — K219 Gastro-esophageal reflux disease without esophagitis: Secondary | ICD-10-CM | POA: Diagnosis not present

## 2020-06-14 DIAGNOSIS — Z6836 Body mass index (BMI) 36.0-36.9, adult: Secondary | ICD-10-CM | POA: Diagnosis not present

## 2020-06-14 DIAGNOSIS — G5602 Carpal tunnel syndrome, left upper limb: Secondary | ICD-10-CM | POA: Diagnosis not present

## 2020-06-14 DIAGNOSIS — J301 Allergic rhinitis due to pollen: Secondary | ICD-10-CM | POA: Diagnosis not present

## 2020-06-14 DIAGNOSIS — M545 Low back pain: Secondary | ICD-10-CM | POA: Diagnosis not present

## 2020-06-14 DIAGNOSIS — E782 Mixed hyperlipidemia: Secondary | ICD-10-CM | POA: Diagnosis not present

## 2020-06-23 ENCOUNTER — Other Ambulatory Visit: Payer: Self-pay

## 2020-06-23 ENCOUNTER — Encounter (HOSPITAL_BASED_OUTPATIENT_CLINIC_OR_DEPARTMENT_OTHER): Payer: Self-pay | Admitting: Orthopedic Surgery

## 2020-06-27 ENCOUNTER — Other Ambulatory Visit (HOSPITAL_COMMUNITY)
Admission: RE | Admit: 2020-06-27 | Discharge: 2020-06-27 | Disposition: A | Discharge status: Home / Self Care | Payer: PPO | Source: Ambulatory Visit | Attending: Orthopedic Surgery | Admitting: Orthopedic Surgery

## 2020-06-27 DIAGNOSIS — Z20822 Contact with and (suspected) exposure to covid-19: Secondary | ICD-10-CM | POA: Diagnosis not present

## 2020-06-27 DIAGNOSIS — Z01812 Encounter for preprocedural laboratory examination: Secondary | ICD-10-CM | POA: Insufficient documentation

## 2020-06-27 LAB — SARS CORONAVIRUS 2 (TAT 6-24 HRS): SARS Coronavirus 2: NEGATIVE

## 2020-06-27 NOTE — Progress Notes (Signed)

## 2020-06-30 ENCOUNTER — Encounter (HOSPITAL_BASED_OUTPATIENT_CLINIC_OR_DEPARTMENT_OTHER)
Admission: RE | Disposition: A | Discharge status: Home / Self Care | Payer: Self-pay | Source: Home / Self Care | Attending: Orthopedic Surgery

## 2020-06-30 ENCOUNTER — Ambulatory Visit (HOSPITAL_BASED_OUTPATIENT_CLINIC_OR_DEPARTMENT_OTHER): Payer: Worker's Compensation | Admitting: Anesthesiology

## 2020-06-30 ENCOUNTER — Ambulatory Visit (HOSPITAL_BASED_OUTPATIENT_CLINIC_OR_DEPARTMENT_OTHER)
Admission: RE | Admit: 2020-06-30 | Discharge: 2020-06-30 | Disposition: A | Discharge status: Home / Self Care | Payer: Worker's Compensation | Attending: Orthopedic Surgery | Admitting: Orthopedic Surgery

## 2020-06-30 ENCOUNTER — Encounter (HOSPITAL_BASED_OUTPATIENT_CLINIC_OR_DEPARTMENT_OTHER): Payer: Self-pay | Admitting: Orthopedic Surgery

## 2020-06-30 DIAGNOSIS — J449 Chronic obstructive pulmonary disease, unspecified: Secondary | ICD-10-CM | POA: Diagnosis not present

## 2020-06-30 DIAGNOSIS — Z885 Allergy status to narcotic agent status: Secondary | ICD-10-CM | POA: Diagnosis not present

## 2020-06-30 DIAGNOSIS — Z833 Family history of diabetes mellitus: Secondary | ICD-10-CM | POA: Diagnosis not present

## 2020-06-30 DIAGNOSIS — M659 Synovitis and tenosynovitis, unspecified: Secondary | ICD-10-CM | POA: Diagnosis not present

## 2020-06-30 DIAGNOSIS — Z888 Allergy status to other drugs, medicaments and biological substances status: Secondary | ICD-10-CM | POA: Diagnosis not present

## 2020-06-30 DIAGNOSIS — S63012A Subluxation of distal radioulnar joint of left wrist, initial encounter: Secondary | ICD-10-CM | POA: Diagnosis not present

## 2020-06-30 DIAGNOSIS — Y990 Civilian activity done for income or pay: Secondary | ICD-10-CM | POA: Insufficient documentation

## 2020-06-30 DIAGNOSIS — W010XXA Fall on same level from slipping, tripping and stumbling without subsequent striking against object, initial encounter: Secondary | ICD-10-CM | POA: Insufficient documentation

## 2020-06-30 DIAGNOSIS — H409 Unspecified glaucoma: Secondary | ICD-10-CM | POA: Diagnosis not present

## 2020-06-30 DIAGNOSIS — S63592A Other specified sprain of left wrist, initial encounter: Secondary | ICD-10-CM | POA: Diagnosis not present

## 2020-06-30 DIAGNOSIS — Z8249 Family history of ischemic heart disease and other diseases of the circulatory system: Secondary | ICD-10-CM | POA: Insufficient documentation

## 2020-06-30 DIAGNOSIS — Z90710 Acquired absence of both cervix and uterus: Secondary | ICD-10-CM | POA: Diagnosis not present

## 2020-06-30 DIAGNOSIS — K219 Gastro-esophageal reflux disease without esophagitis: Secondary | ICD-10-CM | POA: Diagnosis not present

## 2020-06-30 DIAGNOSIS — R29810 Facial weakness: Secondary | ICD-10-CM | POA: Diagnosis not present

## 2020-06-30 DIAGNOSIS — Y9389 Activity, other specified: Secondary | ICD-10-CM | POA: Insufficient documentation

## 2020-06-30 DIAGNOSIS — I739 Peripheral vascular disease, unspecified: Secondary | ICD-10-CM | POA: Diagnosis not present

## 2020-06-30 DIAGNOSIS — S63512A Sprain of carpal joint of left wrist, initial encounter: Secondary | ICD-10-CM | POA: Diagnosis not present

## 2020-06-30 DIAGNOSIS — S63002A Unspecified subluxation of left wrist and hand, initial encounter: Secondary | ICD-10-CM | POA: Diagnosis not present

## 2020-06-30 HISTORY — DX: Gastro-esophageal reflux disease without esophagitis: K21.9

## 2020-06-30 HISTORY — PX: WRIST ARTHROSCOPY WITH FOVEAL TRIANGULAR FIBROCARTILAGE COMPLEX REPAIR: SHX6403

## 2020-06-30 HISTORY — PX: WRIST ARTHROSCOPY: SHX838

## 2020-06-30 SURGERY — ARTHROSCOPY, WRIST
Anesthesia: Monitor Anesthesia Care | Site: Wrist | Laterality: Left

## 2020-06-30 MED ORDER — LIDOCAINE-EPINEPHRINE (PF) 1.5 %-1:200000 IJ SOLN
INTRAMUSCULAR | Status: DC | PRN
  Administered 2020-06-30: 10 mL via PERINEURAL

## 2020-06-30 MED ORDER — CEFAZOLIN SODIUM-DEXTROSE 2-4 GM/100ML-% IV SOLN
INTRAVENOUS | Status: AC
  Filled 2020-06-30: qty 100

## 2020-06-30 MED ORDER — OXYCODONE HCL 5 MG/5ML PO SOLN: 5.0000 mg | Freq: Once | ORAL | Status: DC | PRN

## 2020-06-30 MED ORDER — SODIUM CHLORIDE 0.9 % IR SOLN
Status: DC | PRN
  Administered 2020-06-30: 3000 mL

## 2020-06-30 MED ORDER — ACETAMINOPHEN 160 MG/5ML PO SOLN: 1000.0000 mg | Freq: Once | ORAL | Status: DC | PRN

## 2020-06-30 MED ORDER — ONDANSETRON HCL 4 MG/2ML IJ SOLN
INTRAMUSCULAR | Status: AC
  Filled 2020-06-30: qty 2

## 2020-06-30 MED ORDER — FENTANYL CITRATE (PF) 100 MCG/2ML IJ SOLN
50.0000 ug | Freq: Once | INTRAMUSCULAR | Status: AC
  Administered 2020-06-30: 50 ug via INTRAVENOUS

## 2020-06-30 MED ORDER — CEFAZOLIN SODIUM-DEXTROSE 2-4 GM/100ML-% IV SOLN
2.0000 g | INTRAVENOUS | Status: AC
  Administered 2020-06-30: 2 g via INTRAVENOUS

## 2020-06-30 MED ORDER — OXYCODONE HCL 5 MG PO TABS: 5.0000 mg | ORAL_TABLET | Freq: Once | ORAL | Status: DC | PRN

## 2020-06-30 MED ORDER — ACETAMINOPHEN 500 MG PO TABS: 1000.0000 mg | ORAL_TABLET | Freq: Once | ORAL | Status: DC | PRN

## 2020-06-30 MED ORDER — ONDANSETRON HCL 4 MG/2ML IJ SOLN
INTRAMUSCULAR | Status: DC | PRN
  Administered 2020-06-30: 4 mg via INTRAVENOUS

## 2020-06-30 MED ORDER — ACETAMINOPHEN 10 MG/ML IV SOLN: 1000.0000 mg | Freq: Once | INTRAVENOUS | Status: DC | PRN

## 2020-06-30 MED ORDER — TRAMADOL HCL 50 MG PO TABS: 50.0000 mg | ORAL_TABLET | Freq: Four times a day (QID) | ORAL | 0 refills | Status: DC | PRN

## 2020-06-30 MED ORDER — FENTANYL CITRATE (PF) 100 MCG/2ML IJ SOLN
INTRAMUSCULAR | Status: AC
  Filled 2020-06-30: qty 2

## 2020-06-30 MED ORDER — BUPIVACAINE-EPINEPHRINE (PF) 0.5% -1:200000 IJ SOLN
INTRAMUSCULAR | Status: DC | PRN
Start: 2020-06-30 — End: 2020-06-30
  Administered 2020-06-30: 30 mL via PERINEURAL

## 2020-06-30 MED ORDER — FENTANYL CITRATE (PF) 100 MCG/2ML IJ SOLN: 25.0000 ug | INTRAMUSCULAR | Status: DC | PRN

## 2020-06-30 MED ORDER — LACTATED RINGERS IV SOLN: INTRAVENOUS | Status: DC

## 2020-06-30 MED ORDER — PROPOFOL 500 MG/50ML IV EMUL
INTRAVENOUS | Status: AC
  Filled 2020-06-30: qty 50

## 2020-06-30 MED ORDER — MIDAZOLAM HCL 2 MG/2ML IJ SOLN
INTRAMUSCULAR | Status: AC
  Filled 2020-06-30: qty 2

## 2020-06-30 MED ORDER — PROPOFOL 500 MG/50ML IV EMUL
INTRAVENOUS | Status: DC | PRN
  Administered 2020-06-30: 50 ug/kg/min via INTRAVENOUS

## 2020-06-30 SURGICAL SUPPLY — 75 items
APL PRP STRL LF DISP 70% ISPRP (MISCELLANEOUS) ×1
BLADE EAR TYMPAN 2.5 60D BEAV (BLADE) IMPLANT
BLADE MINI RND TIP GREEN BEAV (BLADE) IMPLANT
BLADE SURG 15 STRL LF DISP TIS (BLADE) ×1 IMPLANT
BLADE SURG 15 STRL SS (BLADE) ×2
BNDG CMPR 9X4 STRL LF SNTH (GAUZE/BANDAGES/DRESSINGS)
BNDG COHESIVE 3X5 TAN STRL LF (GAUZE/BANDAGES/DRESSINGS) ×2 IMPLANT
BNDG COHESIVE 4X5 TAN STRL (GAUZE/BANDAGES/DRESSINGS) ×1 IMPLANT
BNDG ESMARK 4X9 LF (GAUZE/BANDAGES/DRESSINGS) IMPLANT
BNDG GAUZE ELAST 4 BULKY (GAUZE/BANDAGES/DRESSINGS) ×2 IMPLANT
BURR SM HUB 3.0 OVAL (BLADE) IMPLANT
CANISTER SUCT 1200ML W/VALVE (MISCELLANEOUS) IMPLANT
CHLORAPREP W/TINT 26 (MISCELLANEOUS) ×2 IMPLANT
CORD BIPOLAR FORCEPS 12FT (ELECTRODE) IMPLANT
COVER BACK TABLE 60X90IN (DRAPES) ×2 IMPLANT
COVER MAYO STAND STRL (DRAPES) ×2 IMPLANT
COVER WAND RF STERILE (DRAPES) IMPLANT
CUFF TOURN SGL QUICK 18X4 (TOURNIQUET CUFF) ×1 IMPLANT
DRAPE EXTREMITY T 121X128X90 (DISPOSABLE) ×2 IMPLANT
DRAPE IMP U-DRAPE 54X76 (DRAPES) ×2 IMPLANT
DRAPE OEC MINIVIEW 54X84 (DRAPES) ×1 IMPLANT
DRAPE SURG 17X23 STRL (DRAPES) ×2 IMPLANT
ELECT SMALL JOINT 90D BASC (ELECTRODE) IMPLANT
GAUZE SPONGE 4X4 12PLY STRL (GAUZE/BANDAGES/DRESSINGS) ×2 IMPLANT
GAUZE XEROFORM 1X8 LF (GAUZE/BANDAGES/DRESSINGS) ×2 IMPLANT
GLOVE BIOGEL M 6.5 STRL (GLOVE) ×1 IMPLANT
GLOVE BIOGEL PI IND STRL 7.0 (GLOVE) IMPLANT
GLOVE BIOGEL PI IND STRL 8.5 (GLOVE) ×1 IMPLANT
GLOVE BIOGEL PI INDICATOR 7.0 (GLOVE) ×1
GLOVE BIOGEL PI INDICATOR 8.5 (GLOVE) ×1
GLOVE SURG ORTHO 8.0 STRL STRW (GLOVE) ×2 IMPLANT
GOWN STRL REUS W/ TWL LRG LVL3 (GOWN DISPOSABLE) ×1 IMPLANT
GOWN STRL REUS W/TWL LRG LVL3 (GOWN DISPOSABLE) ×2
GOWN STRL REUS W/TWL XL LVL3 (GOWN DISPOSABLE) ×3 IMPLANT
IV NS IRRIG 3000ML ARTHROMATIC (IV SOLUTION) ×2 IMPLANT
IV SET EXT 30 76VOL 4 MALE LL (IV SETS) ×2 IMPLANT
MANIFOLD NEPTUNE II (INSTRUMENTS) IMPLANT
NDL EPIDURAL TUOHY 20GX3.5 (NEEDLE) IMPLANT
NDL SAFETY ECLIPSE 18X1.5 (NEEDLE) ×3 IMPLANT
NDL SPNL 18GX3.5 QUINCKE PK (NEEDLE) IMPLANT
NEEDLE HYPO 18GX1.5 SHARP (NEEDLE) ×6
NEEDLE HYPO 22GX1.5 SAFETY (NEEDLE) ×2 IMPLANT
NEEDLE SPNL 18GX3.5 QUINCKE PK (NEEDLE) IMPLANT
NEEDLE TUOHY 20GX3.5 (NEEDLE) IMPLANT
NS IRRIG 1000ML POUR BTL (IV SOLUTION) IMPLANT
PACK BASIN DAY SURGERY FS (CUSTOM PROCEDURE TRAY) ×2 IMPLANT
PAD CAST 3X4 CTTN HI CHSV (CAST SUPPLIES) ×1 IMPLANT
PADDING CAST ABS 3INX4YD NS (CAST SUPPLIES) ×1
PADDING CAST ABS 4INX4YD NS (CAST SUPPLIES) ×1
PADDING CAST ABS COTTON 3X4 (CAST SUPPLIES) ×1 IMPLANT
PADDING CAST ABS COTTON 4X4 ST (CAST SUPPLIES) ×1 IMPLANT
PADDING CAST COTTON 3X4 STRL (CAST SUPPLIES) ×2
SET SM JOINT TUBING/CANN (CANNULA) IMPLANT
SHAVER DISSECTOR 3.0 (BURR) IMPLANT
SHAVER SABRE 2.0 (BURR) ×1 IMPLANT
SLEEVE SCD COMPRESS KNEE MED (MISCELLANEOUS) ×1 IMPLANT
SLING ARM FOAM STRAP LRG (SOFTGOODS) ×1 IMPLANT
SPLINT PLASTER CAST XFAST 3X15 (CAST SUPPLIES) IMPLANT
SPLINT PLASTER XTRA FASTSET 3X (CAST SUPPLIES) ×30
STOCKINETTE 4X48 STRL (DRAPES) ×2 IMPLANT
SUCTION FRAZIER HANDLE 10FR (MISCELLANEOUS)
SUCTION TUBE FRAZIER 10FR DISP (MISCELLANEOUS) IMPLANT
SUT ETHILON 4 0 PS 2 18 (SUTURE) ×2 IMPLANT
SUT MERSILENE 4 0 P 3 (SUTURE) ×1 IMPLANT
SUT PDS AB 2-0 CT2 27 (SUTURE) IMPLANT
SUT VIC AB 2-0 PS2 27 (SUTURE) IMPLANT
SUT VICRYL 4-0 PS2 18IN ABS (SUTURE) ×1 IMPLANT
SYR BULB EAR ULCER 3OZ GRN STR (SYRINGE) ×2 IMPLANT
SYR CONTROL 10ML LL (SYRINGE) ×2 IMPLANT
TOWEL GREEN STERILE FF (TOWEL DISPOSABLE) ×4 IMPLANT
TUBE CONNECTING 20X1/4 (TUBING) IMPLANT
TUBING ARTHROSCOPY IRRIG 16FT (MISCELLANEOUS) IMPLANT
UNDERPAD 30X36 HEAVY ABSORB (UNDERPADS AND DIAPERS) ×2 IMPLANT
WAND 1.5 MICROBLATOR (SURGICAL WAND) IMPLANT
WATER STERILE IRR 1000ML POUR (IV SOLUTION) ×2 IMPLANT

## 2020-06-30 NOTE — Anesthesia Preprocedure Evaluation (Signed)
Anesthesia Evaluation  Patient identified by MRN, date of birth, ID band Patient awake    Reviewed: Allergy & Precautions, NPO status , Patient's Chart, lab work & pertinent test results  History of Anesthesia Complications Negative for: history of anesthetic complications  Airway Mallampati: II  TM Distance: >3 FB Neck ROM: Full    Dental  (+) Dental Advisory Given, Teeth Intact   Pulmonary neg shortness of breath, asthma , neg sleep apnea, COPD,  COPD inhaler, neg recent URI,    breath sounds clear to auscultation       Cardiovascular + Peripheral Vascular Disease   Rhythm:Regular     Neuro/Psych Congenital left facial droop negative psych ROS   GI/Hepatic Neg liver ROS, GERD  Medicated and Controlled,  Endo/Other  negative endocrine ROS  Renal/GU negative Renal ROS     Musculoskeletal   Abdominal   Peds  Hematology negative hematology ROS (+)   Anesthesia Other Findings   Reproductive/Obstetrics                             Anesthesia Physical Anesthesia Plan  ASA: II  Anesthesia Plan: MAC and Regional   Post-op Pain Management:    Induction: Intravenous  PONV Risk Score and Plan: 2 and Treatment may vary due to age or medical condition and Propofol infusion  Airway Management Planned: Nasal Cannula  Additional Equipment: None  Intra-op Plan:   Post-operative Plan:   Informed Consent: I have reviewed the patients History and Physical, chart, labs and discussed the procedure including the risks, benefits and alternatives for the proposed anesthesia with the patient or authorized representative who has indicated his/her understanding and acceptance.     Dental advisory given  Plan Discussed with: CRNA and Surgeon  Anesthesia Plan Comments:         Anesthesia Quick Evaluation

## 2020-06-30 NOTE — Progress Notes (Signed)
Assisted Dr. Moser with left, ultrasound guided, axillary block. Side rails up, monitors on throughout procedure. See vital signs in flow sheet. Tolerated Procedure well. ° °

## 2020-06-30 NOTE — Anesthesia Postprocedure Evaluation (Signed)
Anesthesia Post Note  Patient: Tami Dickson  Procedure(s) Performed: ARTHROSCOPY WRIST POSSIBLE DEBRIDEMENT; OPEN STABILIZATION EXSTENSOR CARPI ULNARIS; DEEPENING ULNAR GROOVE (Left Wrist) WRIST ARTHROSCOPY WITH LUNOTRIQUETRAL/TRIANGULAR FIBROCARTILAGE COMPLEX REPAIR (Left Wrist)     Patient location during evaluation: PACU Anesthesia Type: Regional and MAC Level of consciousness: awake and alert Pain management: pain level controlled Vital Signs Assessment: post-procedure vital signs reviewed and stable Respiratory status: spontaneous breathing, nonlabored ventilation, respiratory function stable and patient connected to nasal cannula oxygen Cardiovascular status: stable and blood pressure returned to baseline Postop Assessment: no apparent nausea or vomiting Anesthetic complications: no   No complications documented.  Last Vitals:  Vitals:   06/30/20 1330 06/30/20 1400  BP: (!) 144/66 (!) 144/74  Pulse: 62 60  Resp: 14 16  Temp:  36.4 C  SpO2: 96% 96%    Last Pain:  Vitals:   06/30/20 1400  TempSrc:   PainSc: 0-No pain                 Romelo Sciandra

## 2020-06-30 NOTE — Anesthesia Procedure Notes (Signed)
Anesthesia Regional Block: Axillary brachial plexus block   Pre-Anesthetic Checklist: ,, timeout performed, Correct Patient, Correct Site, Correct Laterality, Correct Procedure, Correct Position, site marked, Risks and benefits discussed,  Surgical consent,  Pre-op evaluation,  At surgeon's request and post-op pain management  Laterality: Left and Upper  Prep: chloraprep       Needles:  Injection technique: Single-shot     Needle Length: 9cm  Needle Gauge: 22     Additional Needles: Arrow StimuQuik ECHO Echogenic Stimulating PNB Needle  Procedures:,,,, ultrasound used (permanent image in chart),,,,  Narrative:  Start time: 06/30/2020 10:45 AM End time: 06/30/2020 10:51 AM Injection made incrementally with aspirations every 5 mL.  Performed by: Personally  Anesthesiologist: Oleta Mouse, MD

## 2020-06-30 NOTE — H&P (Signed)
Tami Dickson Tami Dickson is an 69 y.o. female.   Chief Complaint: ulnar wrist pain left KPT:WSFKC is a 69 year old female referred by Dr. Lesly Dukes for consultation regarding an injury she sustained to her left wrist in a fall. The injury occurred on 02/08/2000. This was on her outstretched left hand she. She tripped over a cord at work. Was working in the Forensic psychologist at [employer]. She did not seek medical attention till the next day. She was subsequently referred to Dr. Clarisa Kindred who saw her on March 19. She was complaining of pain on the ulnar aspect of her wrist. This was significant at the time has improved slightly. She does have some discomfort hit her thumb. She states she has a regular VAS score throbbing in nature 5/10 with supination 8/10. She has no prior history of injury. She has been taking Naprosyn which has not helped. She had an MRI scheduled that time return to see Dr. Novella Olive following this on April 12 the MRIs done at [place] and revealed a subluxation of the ECU tendon questionable TFCC tear and lunotriquetral tear scapholunate ligament tear and arthritis at the carpometacarpal joint of her thumb. She has no history of diabetes thyroid problems or gout. She has a history of arthritis. Family history is positive diabetes negative for the remainder. She has been tested. She had an injection to the wrist at that time. Was placed on Celebrex. This was done in an effort to see if it would help settle symptoms for her. She states she continues to have pain on the ulnar aspect of her wrist.She does have a very shallow groove for the ulnar seat of the ECU tendon.    Past Medical History:  Diagnosis Date  . COPD (chronic obstructive pulmonary disease) ([place])   . GERD (gastroesophageal reflux disease)   . Glaucoma   . Peripheral vascular disease [facility])     Past Surgical History:  Procedure Laterality Date  . ABDOMINAL HYSTERECTOMY    . EYE SURGERY Bilateral   . ROTATOR CUFF REPAIR    . TONSILLECTOMY       Family History  Problem Relation Age of Onset  . Heart disease Mother   . Varicose Veins Mother   . Heart disease Father   . Varicose Veins Sister   . Hypertension Sister   . Allergic rhinitis Neg Hx   . Asthma Neg Hx    Social History:  reports that she has never smoked. She has never used smokeless tobacco. She reports previous alcohol use. She reports that she does not use drugs.  Allergies:  Allergies  Allergen Reactions  . Atorvastatin Other (See Comments)    Joint pain   . Codeine Nausea And Vomiting  . Statins Other (See Comments)    Joints hurt    No medications prior to admission.    No results found for this or any previous visit (from the past 48 hour(s)).  No results found.   Pertinent items are noted in HPI.  Height 5\' 3"  (1.6 m), weight 93 kg.  General appearance: alert, cooperative and appears stated age Head: atraumatic Neck: no JVD Resp: clear to auscultation bilaterally Cardio: regular rate and rhythm, S1, S2 normal, no murmur, click, rub or gallop GI: soft, non-tender; bowel sounds normal; no masses,  no organomegaly Extremities: ulnar wrist pain left wrist Pulses: 2+ and symmetric Skin: Skin color, texture, turgor normal. No rashes or lesions Neurologic: Grossly normal Incision/Wound: na  Assessment/Plan  Diagnosis LT TFCC tear left wrist  with subluxating ECU tendon   Plan: We have discussed proceeding with a arthroscopy of her wrist with open repair ECU tendon possible deepening of the groove. Pre-peripostoperative course been discussed along with risks and complications. She is aware there is no guarantee to the surgery the possibility of infection recurrence injury to arteries nerves tendons complete relief symptoms just possibility of stiffness. She would like to proceed in the schedule as an outpatient under regional anesthesia.    Daryll Brod 06/30/2020, 5:19 AM

## 2020-06-30 NOTE — Transfer of Care (Signed)
Immediate Anesthesia Transfer of Care Note  Patient: Tami Dickson  Procedure(s) Performed: ARTHROSCOPY WRIST POSSIBLE DEBRIDEMENT; OPEN STABILIZATION EXSTENSOR CARPI ULNARIS; DEEPENING ULNAR GROOVE (Left Wrist) WRIST ARTHROSCOPY WITH LUNOTRIQUETRAL/TRIANGULAR FIBROCARTILAGE COMPLEX REPAIR (Left Wrist)  Patient Location: PACU  Anesthesia Type:MAC combined with regional for post-op pain  Level of Consciousness: awake, alert  and oriented  Airway & Oxygen Therapy: Patient Spontanous Breathing and Patient connected to face mask oxygen  Post-op Assessment: Report given to RN and Post -op Vital signs reviewed and stable  Post vital signs: Reviewed and stable  Last Vitals:  Vitals Value Taken Time  BP 147/60 06/30/20 1246  Temp    Pulse 77 06/30/20 1248  Resp 11 06/30/20 1248  SpO2 98 % 06/30/20 1248  Vitals shown include unvalidated device data.  Last Pain:  Vitals:   06/30/20 0929  TempSrc: Oral  PainSc: 0-No pain         Complications: No complications documented.

## 2020-06-30 NOTE — Discharge Instructions (Signed)
°  Post Anesthesia Home Care Instructions  Activity: Get plenty of rest for the remainder of the day. A responsible individual must stay with you for 24 hours following the procedure.  For the next 24 hours, DO NOT: -Drive a car -Operate machinery -Drink alcoholic beverages -Take any medication unless instructed by your physician -Make any legal decisions or sign important papers.  Meals: Start with liquid foods such as gelatin or soup. Progress to regular foods as tolerated. Avoid greasy, spicy, heavy foods. If nausea and/or vomiting occur, drink only clear liquids until the nausea and/or vomiting subsides. Call your physician if vomiting continues.  Special Instructions/Symptoms: Your throat may feel dry or sore from the anesthesia or the breathing tube placed in your throat during surgery. If this causes discomfort, gargle with warm salt water. The discomfort should disappear within 24 hours.  Regional Anesthesia Blocks  1. Numbness or the inability to move the "blocked" extremity may last from 3-48 hours after placement. The length of time depends on the medication injected and your individual response to the medication. If the numbness is not going away after 48 hours, call your surgeon.  2. The extremity that is blocked will need to be protected until the numbness is gone and the  Strength has returned. Because you cannot feel it, you will need to take extra care to avoid injury. Because it may be weak, you may have difficulty moving it or using it. You may not know what position it is in without looking at it while the block is in effect.  3. For blocks in the legs and feet, returning to weight bearing and walking needs to be done carefully. You will need to wait until the numbness is entirely gone and the strength has returned. You should be able to move your leg and foot normally before you try and bear weight or walk. You will need someone to be with you when you first try to ensure  you do not fall and possibly risk injury.  4. Bruising and tenderness at the needle site are common side effects and will resolve in a few days.  5. Persistent numbness or new problems with movement should be communicated to the surgeon or the [facility] ([phone])/ [facility] ([phone]). Hand Center Instructions Hand Surgery  Wound Care: Keep your hand elevated above the level of your heart.  Do not allow it to dangle by your side.  Keep the dressing dry and do not remove it unless your doctor advises you to do so.  He will usually change it at the time of your post-op visit.  Moving your fingers is advised to stimulate circulation but will depend on the site of your surgery.  If you have a splint applied, your doctor will advise you regarding movement.  Activity: Do not drive or operate machinery today.  Rest today and then you may return to your normal activity and work as indicated by your physician.  Diet:  Drink liquids today or eat a light diet.  You may resume a regular diet tomorrow.    General expectations: Pain for two to three days. Fingers may become slightly swollen.  Call your doctor if any of the following occur: Severe pain not relieved by pain medication. Elevated temperature. Dressing soaked with blood. Inability to move fingers. White or bluish color to fingers.  

## 2020-06-30 NOTE — Op Note (Signed)
I assisted Surgeon(s) and Role:    * Daryll Brod, MD - Primary    * Leanora Cover, MD on the Procedure(s): ARTHROSCOPY WRIST POSSIBLE DEBRIDEMENT; OPEN STABILIZATION EXSTENSOR CARPI ULNARIS; DEEPENING ULNAR GROOVE WRIST ARTHROSCOPY WITH LUNOTRIQUETRAL/TRIANGULAR FIBROCARTILAGE COMPLEX REPAIR on 06/30/2020.  I provided assistance on this case as follows: positioning of arm, management of arthroscopy equipment, retraction soft tissues.  Electronically signed by: Leanora Cover, MD Date: 06/30/2020 Time: 12:54 PM

## 2020-06-30 NOTE — Brief Op Note (Signed)
06/30/2020  12:41 PM  PATIENT:  Tami Dickson  69 y.o. female  PRE-OPERATIVE DIAGNOSIS:  LEFT LUNOTRIQUETRAL/TRIANGULAR FIBROCARTILAGE COMPLEX TEAR; LEFT SUBLUXATION EXSTENSOR CARPI ULNARIS  POST-OPERATIVE DIAGNOSIS:  LEFT LUNOTRIQUETRAL/TRIANGULAR FIBROCARTILAGE COMPLEX TEAR; LEFT SUBLUXATION EXSTENSOR CARPI ULNARIS  PROCEDURE:  Procedure(s) with comments: ARTHROSCOPY WRIST POSSIBLE DEBRIDEMENT; OPEN STABILIZATION EXSTENSOR CARPI ULNARIS; DEEPENING ULNAR GROOVE (Left) WRIST ARTHROSCOPY WITH LUNOTRIQUETRAL/TRIANGULAR FIBROCARTILAGE COMPLEX REPAIR (Left) - AXILLARY BLOCK  SURGEON:  Surgeon(s) and Role:    * Daryll Brod, MD - Primary    * Leanora Cover, MD  PHYSICIAN ASSISTANT:   ASSISTANTS: Richardo Priest, MD  ANESTHESIA:   regional and IV sedation  EBL: 3 mL  BLOOD ADMINISTERED:none  DRAINS: none   LOCAL MEDICATIONS USED:  NONE  SPECIMEN:  No Specimen  DISPOSITION OF SPECIMEN:  N/A  COUNTS:  YES  TOURNIQUET:   Total Tourniquet Time Documented: Upper Arm (Left) - 27 minutes Total: Upper Arm (Left) - 27 minutes   DICTATION: .Viviann Spare Dictation  PLAN OF CARE: Discharge to home after PACU  PATIENT DISPOSITION:  PACU - hemodynamically stable.

## 2020-06-30 NOTE — Op Note (Signed)
NAME: Tami Dickson RECORD NO: [id number] DATE OF BIRTH: 12-12-1950 FACILITY: [facility] LOCATION:  SURGERY CENTER PHYSICIAN: Wynonia Sours, MD   OPERATIVE REPORT   DATE OF PROCEDURE: 06/30/20    PREOPERATIVE DIAGNOSIS: Ulnar-sided wrist pain with subluxating ECU tendon questionable TFCC tear questionable lunotriquetral tear  left wrist POSTOPERATIVE DIAGNOSIS: Lunotriquetral tear with instability ECU tendon left wrist  PROCEDURE:   Procedure arthroscopy with debridement lunotriquetral tear and open reconstruction nebulization extensor carpi ulnaris tendon left wrist  SURGEON: Daryll Brod, M.D.   ASSISTANT: Leanora Cover, MD   ANESTHESIA:  Regional with sedation   INTRAVENOUS FLUIDS:  Per anesthesia flow sheet.   ESTIMATED BLOOD LOSS:  Minimal.   COMPLICATIONS:  None.   SPECIMENS:  none   TOURNIQUET TIME:    Total Tourniquet Time Documented: Upper Arm (Left) - 27 minutes Total: Upper Arm (Left) - 27 minutes    DISPOSITION:  Stable to PACU.   INDICATIONS: Patient is a 69 year old female who was seen complaining of pain on the ulnar aspect of her right left wrist following a fall.  This is not responded to conservative treatment.  MRI reveals a subluxation of the ECU tendon with a probable lunotriquetral tear possible TFCC tear.  She is desirous of reconstruction of the ECU sheath and with stabilization and arthroscopy debridement of the wrist left side.  Preperi-and postoperative course been discussed along with risks and complications.  She is aware that there is no guarantee to the surgery the p allowed and a timeout taken to confirm patient procedure.  The left arm was placed in the arc arthroscopy tower.  10 pounds of traction was applied.  pOssibility of infection recurrence injury to arteries nerves tendons incomplete relief of symptoms and dystrophy.  In the preoperative area the patient is seen the extremity is marked by both patient and surgeon  antibiotic given.  A supraclavicular block was carried out without difficulty under the direction of the anesthesia department.  OPERATIVE COURSE: Patient is brought to the operating room placed in the supine position with the left arm free.  Prep was done with ChloraPrep a 3-minute dry time was allowed.  A timeout was taken confirming patient procedure.  The limb was placed in the arc arthroscopy tower.  10 pounds of traction was applied.  The joint was inflated to the 3-4 portal.  A transverse incision was made deepened with hemostat blunt trocar was used to enter the joint.  The joint was inspected volar radial wrist ligaments were intact scapholunate ligament was intact there was no significant changes on the articular surface surface of the distal radius or proximal carpal row.  The scope was brought brought ulnarly.  TFCC appeared to be intact.  Moderate synovitis was present.  Significant cartilage changes were present on the ulnar aspect of the lunate.  A irrigation catheter was placed in 6 you a 4-5 portal was then opened after localization with a 22-gauge needle.  Transverse incision was made deepened with a hemostat blunt trocar used to enter the joint.  A probe was entered.  A tear of the lunotriquetral joint was of apparent by being able to pass a probe through the area of the tear.  Triangular fibrocartilage complex was intact.  The scope was introduced in the 4-5 portal the lunotriquetral tear was identified.  The scope was reintroduced and 3 4 and a 2 mm shaver was then introduced into the 4 5 in the debridement of the tear of the lunotriquetral  joint and cartilage changes were was then done with a full-radius shaver.  The midcarpal joint was then inspected through the ulnar midcarpal portal this was localized with a 22-gauge needle a transverse incision made deepened with a hemostat blunt trocar was used to enter the joint the joint was inspected the cartilage of the hamate capitate showed no  change.  Instability was noted to the lunotriquetral joint.  Scapholunate ligament was noted and found to be in tight coaptation.  The instruments were removed.  The portals closed with interrupted 4-0 nylon sutures.  The end was exsanguinated with an Esmarch bandage after removal from the arc arthroscopy tower.  A longitudinal incision was made over the ulnar aspect of the wrist carried down through subcutaneous tissue.  Bleeders were electrocauterized with bipolar.  The dorsal sensory branch to the ulnar nerve was not seen in the wound was especially look for.  An incision was then made on the volar aspect of the wrist and the extensor carpi ulnaris tendon was entirely volarly dislocated.  The retinaculum was then elevated.  Significant filling of tissue in the ulnar groove was present.  This was debrided leaving it attached palmarly.  A tenolysis was then performed to the extensor carpi ulnaris tendon.  This was then brought into the groove.  The extensor retinaculum was then placed underneath the tendon in the reduced position and sutured back onto itself creating a sling holding the ECU tendon dorsally.  This was report performed with multiple horizontal mattress and figure-of-eight 4-0 Mersilene sutures.  This stabilized the tendon over the dorsal aspect of the ulna and the ECU groove.  The wrist was maintained in the pronated position to maintain its position.  The wound was copious irrigated with saline.  The subcutaneous tissue was closed interrupted 4-0 Vicryl and skin with interrupted 4-0 nylon sutures.  A sterile compressive dressing Munster splint with the wrist in pronation was applied.  Deflation of the tourniquet all fingers immediately pink.  She was taken to the recovery room for observation in satisfactory condition.  She will be discharged home to return to the hand center [facility] in 1 week on Tylenol ibuprofen for pain with Ultram for breakthrough.  Daryll Brod, MD Electronically  signed, 06/30/20

## 2020-07-01 ENCOUNTER — Encounter (HOSPITAL_BASED_OUTPATIENT_CLINIC_OR_DEPARTMENT_OTHER): Payer: Self-pay | Admitting: Orthopedic Surgery

## 2020-08-12 DIAGNOSIS — I83891 Varicose veins of right lower extremities with other complications: Secondary | ICD-10-CM | POA: Diagnosis not present

## 2020-08-12 DIAGNOSIS — S81811A Laceration without foreign body, right lower leg, initial encounter: Secondary | ICD-10-CM | POA: Diagnosis not present

## 2020-08-12 DIAGNOSIS — Z23 Encounter for immunization: Secondary | ICD-10-CM | POA: Diagnosis not present

## 2020-08-12 DIAGNOSIS — W268XXA Contact with other sharp object(s), not elsewhere classified, initial encounter: Secondary | ICD-10-CM | POA: Diagnosis not present

## 2020-08-12 DIAGNOSIS — I83899 Varicose veins of unspecified lower extremities with other complications: Secondary | ICD-10-CM | POA: Diagnosis not present

## 2020-08-18 DIAGNOSIS — I83813 Varicose veins of bilateral lower extremities with pain: Secondary | ICD-10-CM | POA: Diagnosis not present

## 2020-08-18 DIAGNOSIS — Z6836 Body mass index (BMI) 36.0-36.9, adult: Secondary | ICD-10-CM | POA: Diagnosis not present

## 2020-08-18 DIAGNOSIS — K2100 Gastro-esophageal reflux disease with esophagitis, without bleeding: Secondary | ICD-10-CM | POA: Diagnosis not present

## 2020-08-18 DIAGNOSIS — H9201 Otalgia, right ear: Secondary | ICD-10-CM | POA: Diagnosis not present

## 2020-09-09 DIAGNOSIS — Z23 Encounter for immunization: Secondary | ICD-10-CM | POA: Diagnosis not present

## 2020-09-26 DIAGNOSIS — G510 Bell's palsy: Secondary | ICD-10-CM | POA: Diagnosis not present

## 2020-09-26 DIAGNOSIS — H402233 Chronic angle-closure glaucoma, bilateral, severe stage: Secondary | ICD-10-CM | POA: Diagnosis not present

## 2020-09-26 DIAGNOSIS — D23121 Other benign neoplasm of skin of left upper eyelid, including canthus: Secondary | ICD-10-CM | POA: Diagnosis not present

## 2020-09-26 DIAGNOSIS — H527 Unspecified disorder of refraction: Secondary | ICD-10-CM | POA: Diagnosis not present

## 2020-09-26 DIAGNOSIS — Z961 Presence of intraocular lens: Secondary | ICD-10-CM | POA: Diagnosis not present

## 2020-09-26 DIAGNOSIS — H0288B Meibomian gland dysfunction left eye, upper and lower eyelids: Secondary | ICD-10-CM | POA: Diagnosis not present

## 2020-09-26 DIAGNOSIS — H43811 Vitreous degeneration, right eye: Secondary | ICD-10-CM | POA: Diagnosis not present

## 2020-09-26 DIAGNOSIS — H0288A Meibomian gland dysfunction right eye, upper and lower eyelids: Secondary | ICD-10-CM | POA: Diagnosis not present

## 2020-09-26 DIAGNOSIS — Z79899 Other long term (current) drug therapy: Secondary | ICD-10-CM | POA: Diagnosis not present

## 2020-10-04 DIAGNOSIS — R21 Rash and other nonspecific skin eruption: Secondary | ICD-10-CM | POA: Diagnosis not present

## 2020-10-04 DIAGNOSIS — Z6837 Body mass index (BMI) 37.0-37.9, adult: Secondary | ICD-10-CM | POA: Diagnosis not present

## 2020-10-04 DIAGNOSIS — M545 Low back pain, unspecified: Secondary | ICD-10-CM | POA: Diagnosis not present

## 2020-10-11 DIAGNOSIS — M5416 Radiculopathy, lumbar region: Secondary | ICD-10-CM | POA: Diagnosis not present

## 2020-10-11 DIAGNOSIS — M545 Low back pain, unspecified: Secondary | ICD-10-CM | POA: Diagnosis not present

## 2020-10-19 DIAGNOSIS — Z23 Encounter for immunization: Secondary | ICD-10-CM | POA: Diagnosis not present

## 2020-10-21 DIAGNOSIS — Z6837 Body mass index (BMI) 37.0-37.9, adult: Secondary | ICD-10-CM | POA: Diagnosis not present

## 2020-10-21 DIAGNOSIS — T881XXA Other complications following immunization, not elsewhere classified, initial encounter: Secondary | ICD-10-CM | POA: Diagnosis not present

## 2020-11-14 DIAGNOSIS — M545 Low back pain, unspecified: Secondary | ICD-10-CM | POA: Diagnosis not present

## 2020-12-02 DIAGNOSIS — K219 Gastro-esophageal reflux disease without esophagitis: Secondary | ICD-10-CM | POA: Diagnosis not present

## 2020-12-02 DIAGNOSIS — E7849 Other hyperlipidemia: Secondary | ICD-10-CM | POA: Diagnosis not present

## 2020-12-02 DIAGNOSIS — I10 Essential (primary) hypertension: Secondary | ICD-10-CM | POA: Diagnosis not present

## 2020-12-09 DIAGNOSIS — I10 Essential (primary) hypertension: Secondary | ICD-10-CM | POA: Diagnosis not present

## 2020-12-09 DIAGNOSIS — E782 Mixed hyperlipidemia: Secondary | ICD-10-CM | POA: Diagnosis not present

## 2020-12-09 DIAGNOSIS — Z1329 Encounter for screening for other suspected endocrine disorder: Secondary | ICD-10-CM | POA: Diagnosis not present

## 2020-12-09 DIAGNOSIS — Z1159 Encounter for screening for other viral diseases: Secondary | ICD-10-CM | POA: Diagnosis not present

## 2020-12-09 DIAGNOSIS — K2100 Gastro-esophageal reflux disease with esophagitis, without bleeding: Secondary | ICD-10-CM | POA: Diagnosis not present

## 2020-12-09 DIAGNOSIS — Z131 Encounter for screening for diabetes mellitus: Secondary | ICD-10-CM | POA: Diagnosis not present

## 2020-12-09 DIAGNOSIS — K7689 Other specified diseases of liver: Secondary | ICD-10-CM | POA: Diagnosis not present

## 2020-12-13 DIAGNOSIS — S39012A Strain of muscle, fascia and tendon of lower back, initial encounter: Secondary | ICD-10-CM | POA: Diagnosis not present

## 2020-12-13 DIAGNOSIS — Z6837 Body mass index (BMI) 37.0-37.9, adult: Secondary | ICD-10-CM | POA: Diagnosis not present

## 2020-12-13 DIAGNOSIS — J449 Chronic obstructive pulmonary disease, unspecified: Secondary | ICD-10-CM | POA: Diagnosis not present

## 2020-12-13 DIAGNOSIS — I10 Essential (primary) hypertension: Secondary | ICD-10-CM | POA: Diagnosis not present

## 2020-12-13 DIAGNOSIS — K2100 Gastro-esophageal reflux disease with esophagitis, without bleeding: Secondary | ICD-10-CM | POA: Diagnosis not present

## 2020-12-26 DIAGNOSIS — R1011 Right upper quadrant pain: Secondary | ICD-10-CM | POA: Diagnosis not present

## 2020-12-26 DIAGNOSIS — R7989 Other specified abnormal findings of blood chemistry: Secondary | ICD-10-CM | POA: Diagnosis not present

## 2020-12-28 DIAGNOSIS — M5127 Other intervertebral disc displacement, lumbosacral region: Secondary | ICD-10-CM | POA: Diagnosis not present

## 2020-12-28 DIAGNOSIS — M47816 Spondylosis without myelopathy or radiculopathy, lumbar region: Secondary | ICD-10-CM | POA: Diagnosis not present

## 2020-12-28 DIAGNOSIS — M5126 Other intervertebral disc displacement, lumbar region: Secondary | ICD-10-CM | POA: Diagnosis not present

## 2020-12-28 DIAGNOSIS — M48061 Spinal stenosis, lumbar region without neurogenic claudication: Secondary | ICD-10-CM | POA: Diagnosis not present

## 2020-12-28 DIAGNOSIS — M9973 Connective tissue and disc stenosis of intervertebral foramina of lumbar region: Secondary | ICD-10-CM | POA: Diagnosis not present

## 2021-01-19 DIAGNOSIS — M47816 Spondylosis without myelopathy or radiculopathy, lumbar region: Secondary | ICD-10-CM | POA: Diagnosis not present

## 2021-01-19 DIAGNOSIS — M5416 Radiculopathy, lumbar region: Secondary | ICD-10-CM | POA: Diagnosis not present

## 2021-01-30 DIAGNOSIS — K219 Gastro-esophageal reflux disease without esophagitis: Secondary | ICD-10-CM | POA: Diagnosis not present

## 2021-01-30 DIAGNOSIS — I10 Essential (primary) hypertension: Secondary | ICD-10-CM | POA: Diagnosis not present

## 2021-01-30 DIAGNOSIS — E7849 Other hyperlipidemia: Secondary | ICD-10-CM | POA: Diagnosis not present

## 2021-02-13 DIAGNOSIS — M5416 Radiculopathy, lumbar region: Secondary | ICD-10-CM | POA: Diagnosis not present

## 2021-02-28 DIAGNOSIS — M5416 Radiculopathy, lumbar region: Secondary | ICD-10-CM | POA: Diagnosis not present

## 2021-02-28 DIAGNOSIS — M47816 Spondylosis without myelopathy or radiculopathy, lumbar region: Secondary | ICD-10-CM | POA: Diagnosis not present

## 2021-03-03 DIAGNOSIS — Z6837 Body mass index (BMI) 37.0-37.9, adult: Secondary | ICD-10-CM | POA: Diagnosis not present

## 2021-03-03 DIAGNOSIS — E7849 Other hyperlipidemia: Secondary | ICD-10-CM | POA: Diagnosis not present

## 2021-03-03 DIAGNOSIS — R413 Other amnesia: Secondary | ICD-10-CM | POA: Diagnosis not present

## 2021-03-14 DIAGNOSIS — R413 Other amnesia: Secondary | ICD-10-CM | POA: Diagnosis not present

## 2021-03-14 DIAGNOSIS — I6782 Cerebral ischemia: Secondary | ICD-10-CM | POA: Diagnosis not present

## 2021-03-14 DIAGNOSIS — G319 Degenerative disease of nervous system, unspecified: Secondary | ICD-10-CM | POA: Diagnosis not present

## 2021-03-27 DIAGNOSIS — M47816 Spondylosis without myelopathy or radiculopathy, lumbar region: Secondary | ICD-10-CM | POA: Diagnosis not present

## 2021-04-12 DIAGNOSIS — M47816 Spondylosis without myelopathy or radiculopathy, lumbar region: Secondary | ICD-10-CM | POA: Diagnosis not present

## 2021-04-26 DIAGNOSIS — Z961 Presence of intraocular lens: Secondary | ICD-10-CM | POA: Diagnosis not present

## 2021-04-26 DIAGNOSIS — H0288A Meibomian gland dysfunction right eye, upper and lower eyelids: Secondary | ICD-10-CM | POA: Diagnosis not present

## 2021-04-26 DIAGNOSIS — H43811 Vitreous degeneration, right eye: Secondary | ICD-10-CM | POA: Diagnosis not present

## 2021-04-26 DIAGNOSIS — H527 Unspecified disorder of refraction: Secondary | ICD-10-CM | POA: Diagnosis not present

## 2021-04-26 DIAGNOSIS — Z79899 Other long term (current) drug therapy: Secondary | ICD-10-CM | POA: Diagnosis not present

## 2021-04-26 DIAGNOSIS — H402233 Chronic angle-closure glaucoma, bilateral, severe stage: Secondary | ICD-10-CM | POA: Diagnosis not present

## 2021-04-26 DIAGNOSIS — H0288B Meibomian gland dysfunction left eye, upper and lower eyelids: Secondary | ICD-10-CM | POA: Diagnosis not present

## 2021-04-26 DIAGNOSIS — G510 Bell's palsy: Secondary | ICD-10-CM | POA: Diagnosis not present

## 2021-04-26 DIAGNOSIS — D23121 Other benign neoplasm of skin of left upper eyelid, including canthus: Secondary | ICD-10-CM | POA: Diagnosis not present

## 2021-05-01 DIAGNOSIS — K219 Gastro-esophageal reflux disease without esophagitis: Secondary | ICD-10-CM | POA: Diagnosis not present

## 2021-05-01 DIAGNOSIS — I10 Essential (primary) hypertension: Secondary | ICD-10-CM | POA: Diagnosis not present

## 2021-05-01 DIAGNOSIS — E7849 Other hyperlipidemia: Secondary | ICD-10-CM | POA: Diagnosis not present

## 2021-05-03 DIAGNOSIS — M47816 Spondylosis without myelopathy or radiculopathy, lumbar region: Secondary | ICD-10-CM | POA: Diagnosis not present

## 2021-05-26 DIAGNOSIS — M47816 Spondylosis without myelopathy or radiculopathy, lumbar region: Secondary | ICD-10-CM | POA: Diagnosis not present

## 2021-05-26 DIAGNOSIS — M5416 Radiculopathy, lumbar region: Secondary | ICD-10-CM | POA: Diagnosis not present

## 2021-06-01 DIAGNOSIS — E7849 Other hyperlipidemia: Secondary | ICD-10-CM | POA: Diagnosis not present

## 2021-06-01 DIAGNOSIS — I10 Essential (primary) hypertension: Secondary | ICD-10-CM | POA: Diagnosis not present

## 2021-06-01 DIAGNOSIS — K219 Gastro-esophageal reflux disease without esophagitis: Secondary | ICD-10-CM | POA: Diagnosis not present

## 2021-06-12 DIAGNOSIS — Z1329 Encounter for screening for other suspected endocrine disorder: Secondary | ICD-10-CM | POA: Diagnosis not present

## 2021-06-12 DIAGNOSIS — E782 Mixed hyperlipidemia: Secondary | ICD-10-CM | POA: Diagnosis not present

## 2021-06-12 DIAGNOSIS — R5383 Other fatigue: Secondary | ICD-10-CM | POA: Diagnosis not present

## 2021-06-12 DIAGNOSIS — Z131 Encounter for screening for diabetes mellitus: Secondary | ICD-10-CM | POA: Diagnosis not present

## 2021-06-12 DIAGNOSIS — I10 Essential (primary) hypertension: Secondary | ICD-10-CM | POA: Diagnosis not present

## 2021-06-12 DIAGNOSIS — K7689 Other specified diseases of liver: Secondary | ICD-10-CM | POA: Diagnosis not present

## 2021-06-12 DIAGNOSIS — K2100 Gastro-esophageal reflux disease with esophagitis, without bleeding: Secondary | ICD-10-CM | POA: Diagnosis not present

## 2021-06-26 DIAGNOSIS — M545 Low back pain, unspecified: Secondary | ICD-10-CM | POA: Diagnosis not present

## 2021-06-26 DIAGNOSIS — K2100 Gastro-esophageal reflux disease with esophagitis, without bleeding: Secondary | ICD-10-CM | POA: Diagnosis not present

## 2021-06-26 DIAGNOSIS — Z6837 Body mass index (BMI) 37.0-37.9, adult: Secondary | ICD-10-CM | POA: Diagnosis not present

## 2021-06-26 DIAGNOSIS — Z1212 Encounter for screening for malignant neoplasm of rectum: Secondary | ICD-10-CM | POA: Diagnosis not present

## 2021-06-26 DIAGNOSIS — Z0001 Encounter for general adult medical examination with abnormal findings: Secondary | ICD-10-CM | POA: Diagnosis not present

## 2021-06-26 DIAGNOSIS — I10 Essential (primary) hypertension: Secondary | ICD-10-CM | POA: Diagnosis not present

## 2021-06-26 DIAGNOSIS — J449 Chronic obstructive pulmonary disease, unspecified: Secondary | ICD-10-CM | POA: Diagnosis not present

## 2021-07-03 DIAGNOSIS — M5416 Radiculopathy, lumbar region: Secondary | ICD-10-CM | POA: Diagnosis not present

## 2021-07-07 DIAGNOSIS — S63592D Other specified sprain of left wrist, subsequent encounter: Secondary | ICD-10-CM | POA: Diagnosis not present

## 2021-07-07 DIAGNOSIS — S63092D Other subluxation of left wrist and hand, subsequent encounter: Secondary | ICD-10-CM | POA: Diagnosis not present

## 2021-08-01 DIAGNOSIS — M25559 Pain in unspecified hip: Secondary | ICD-10-CM | POA: Diagnosis not present

## 2021-08-01 DIAGNOSIS — M5416 Radiculopathy, lumbar region: Secondary | ICD-10-CM | POA: Diagnosis not present

## 2021-08-01 DIAGNOSIS — M47816 Spondylosis without myelopathy or radiculopathy, lumbar region: Secondary | ICD-10-CM | POA: Diagnosis not present

## 2021-08-02 DIAGNOSIS — M5136 Other intervertebral disc degeneration, lumbar region: Secondary | ICD-10-CM | POA: Diagnosis not present

## 2021-08-02 DIAGNOSIS — Z6838 Body mass index (BMI) 38.0-38.9, adult: Secondary | ICD-10-CM | POA: Diagnosis not present

## 2021-08-02 DIAGNOSIS — M48061 Spinal stenosis, lumbar region without neurogenic claudication: Secondary | ICD-10-CM | POA: Diagnosis not present

## 2021-08-29 DIAGNOSIS — M48062 Spinal stenosis, lumbar region with neurogenic claudication: Secondary | ICD-10-CM | POA: Diagnosis not present

## 2021-08-29 DIAGNOSIS — R030 Elevated blood-pressure reading, without diagnosis of hypertension: Secondary | ICD-10-CM | POA: Diagnosis not present

## 2021-08-29 DIAGNOSIS — M4316 Spondylolisthesis, lumbar region: Secondary | ICD-10-CM | POA: Diagnosis not present

## 2021-08-29 DIAGNOSIS — Z6838 Body mass index (BMI) 38.0-38.9, adult: Secondary | ICD-10-CM | POA: Diagnosis not present

## 2021-09-06 ENCOUNTER — Other Ambulatory Visit: Payer: Self-pay | Admitting: Neurosurgery

## 2021-09-06 DIAGNOSIS — Z23 Encounter for immunization: Secondary | ICD-10-CM | POA: Diagnosis not present

## 2021-09-30 DIAGNOSIS — R059 Cough, unspecified: Secondary | ICD-10-CM | POA: Diagnosis not present

## 2021-09-30 DIAGNOSIS — Z20828 Contact with and (suspected) exposure to other viral communicable diseases: Secondary | ICD-10-CM | POA: Diagnosis not present

## 2021-09-30 DIAGNOSIS — J329 Chronic sinusitis, unspecified: Secondary | ICD-10-CM | POA: Diagnosis not present

## 2021-10-02 DIAGNOSIS — E7849 Other hyperlipidemia: Secondary | ICD-10-CM | POA: Diagnosis not present

## 2021-10-02 DIAGNOSIS — I10 Essential (primary) hypertension: Secondary | ICD-10-CM | POA: Diagnosis not present

## 2021-10-02 DIAGNOSIS — K219 Gastro-esophageal reflux disease without esophagitis: Secondary | ICD-10-CM | POA: Diagnosis not present

## 2021-11-30 ENCOUNTER — Ambulatory Visit: Admit: 2021-11-30 | Payer: PPO | Admitting: Neurosurgery

## 2021-11-30 SURGERY — POSTERIOR LUMBAR FUSION 1 LEVEL
Anesthesia: General

## 2021-12-07 DIAGNOSIS — R059 Cough, unspecified: Secondary | ICD-10-CM | POA: Diagnosis not present

## 2021-12-07 DIAGNOSIS — Z20828 Contact with and (suspected) exposure to other viral communicable diseases: Secondary | ICD-10-CM | POA: Diagnosis not present

## 2021-12-07 DIAGNOSIS — E7849 Other hyperlipidemia: Secondary | ICD-10-CM | POA: Diagnosis not present

## 2021-12-25 DIAGNOSIS — R5381 Other malaise: Secondary | ICD-10-CM | POA: Diagnosis not present

## 2021-12-25 DIAGNOSIS — Z131 Encounter for screening for diabetes mellitus: Secondary | ICD-10-CM | POA: Diagnosis not present

## 2021-12-25 DIAGNOSIS — E7849 Other hyperlipidemia: Secondary | ICD-10-CM | POA: Diagnosis not present

## 2021-12-25 DIAGNOSIS — E782 Mixed hyperlipidemia: Secondary | ICD-10-CM | POA: Diagnosis not present

## 2021-12-27 DIAGNOSIS — M545 Low back pain, unspecified: Secondary | ICD-10-CM | POA: Diagnosis not present

## 2021-12-27 DIAGNOSIS — I10 Essential (primary) hypertension: Secondary | ICD-10-CM | POA: Diagnosis not present

## 2021-12-27 DIAGNOSIS — Z6836 Body mass index (BMI) 36.0-36.9, adult: Secondary | ICD-10-CM | POA: Diagnosis not present

## 2021-12-27 DIAGNOSIS — J449 Chronic obstructive pulmonary disease, unspecified: Secondary | ICD-10-CM | POA: Diagnosis not present

## 2021-12-27 DIAGNOSIS — K2100 Gastro-esophageal reflux disease with esophagitis, without bleeding: Secondary | ICD-10-CM | POA: Diagnosis not present

## 2022-01-08 ENCOUNTER — Other Ambulatory Visit: Payer: Self-pay | Admitting: Neurosurgery

## 2022-01-17 ENCOUNTER — Other Ambulatory Visit: Payer: Self-pay | Admitting: Neurosurgery

## 2022-01-26 NOTE — Progress Notes (Signed)
Surgical Instructions   Your procedure is scheduled on Thursday 02/01/2022.  Report to [facility] Main Entrance "A" at 05:30 A.M., then check in with the Admitting office.  Call [phone] if you have problems or questions between now and the morning of surgery:   Remember: Do not eat or drink after midnight the night before your surgery    Take these medicines the morning of surgery with A SIP OF WATER:  Dorzolamide-timolol (Cosopt) eye drops Esomeprazole (Nexium) Symbicort inhaler   If needed you may take these medications the morning of surgery: Albuterol (Accuneb) nebulizer treatment Albuterol (Proventil/Ventolin) inhaler Famotidine (Pepcid) Fexofenadine (Allegra) Valacyclovir (Valtrex)   As of today, STOP taking any Aspirin (unless otherwise instructed by your surgeon) or Aspirin-containing products; NSAIDS - Aleve, Naproxen, Ibuprofen, Motrin, Advil, Goody's, BC's, all herbal medications, fish oil, and all vitamins.   After your pre-procedure COVID test  You are not required to quarantine however you are required to wear a well-fitting mask when you are out and around people not in your household.  If your mask becomes wet or soiled, replace with a new one.  Wash your hands often with soap and water for 20 seconds or clean your hands with an alcohol-based hand sanitizer that contains at least 60% alcohol.  Do not share personal items.  Notify your provider: if you are in close contact with someone who has COVID  or if you develop a fever of 100.4 or greater, sneezing, cough, sore throat, shortness of breath or body aches.          Do not wear jewelry or makeup  Do not wear lotions, powders, perfumes/colognes, or deodorant.  Do not shave 48 hours prior to surgery.  Men may shave face and neck.  Do not wear nail polish, gel polish, artificial nails, or any other type of covering on natural nails including fingernails and toenails. If patients have artificial  nails, gel coating, etc. that need to be removed by a nail salon please have this removed prior to surgery or surgery may need to be canceled/delayed if the surgeon/ anesthesia feels like the patient is unable to be adequately monitored.  Do not bring valuables to the hospital - [facility] is not responsible for any belongings or valuables.  Do NOT Smoke (Tobacco/Vaping) or drink Alcohol 24 hours prior to your procedure  If you use a CPAP at night, please bring your mask for your overnight stay.   Contacts, glasses, hearing aids, dentures or partials may not be worn into surgery, please bring cases for these belongings   For patients admitted to the hospital, discharge time will be determined by your treatment team.   Patients discharged the day of surgery will not be allowed to drive home, and someone needs to stay with them for 24 hours.  NO VISITORS WILL BE ALLOWED IN PRE-OP WHERE PATIENTS ARE PREPPED FOR SURGERY.  ONLY 1 SUPPORT PERSON MAY BE PRESENT IN THE WAITING ROOM WHILE YOU ARE IN SURGERY.  IF YOU ARE TO BE ADMITTED, ONCE YOU ARE IN YOUR ROOM YOU WILL BE ALLOWED TWO (2) VISITORS. 1 (ONE) VISITOR MAY STAY OVERNIGHT BUT MUST ARRIVE TO THE ROOM BY 8pm.  Minor children may have two parents present. Special consideration for safety and communication needs will be reviewed on a case by case basis.  Special instructions:    Oral Hygiene is also important to reduce your risk of infection.  Remember - BRUSH YOUR TEETH THE MORNING OF SURGERY WITH YOUR  REGULAR TOOTHPASTE   Reserve- Preparing For Surgery  Before surgery, you can play an important role. Because skin is not sterile, your skin needs to be as free of germs as possible. You can reduce the number of germs on your skin by washing with CHG (chlorahexidine gluconate) Soap before surgery.  CHG is an antiseptic cleaner which kills germs and bonds with the skin to continue killing germs even after washing.     Please do not use if  you have an allergy to CHG or antibacterial soaps. If your skin becomes reddened/irritated stop using the CHG.  Do not shave (including legs and underarms) for at least 48 hours prior to first CHG shower. It is OK to shave your face.  Please follow these instructions carefully.     Shower the NIGHT BEFORE SURGERY and the MORNING OF SURGERY with CHG Soap.   If you chose to wash your hair, wash your hair first as usual with your normal shampoo. After you shampoo, rinse your hair and body thoroughly to remove the shampoo.    Then [employer] and genitals (private parts) with your normal soap and rinse thoroughly to remove soap.  Next use the CHG Soap as you would any other liquid soap. You can apply CHG directly to the skin and wash gently with a clean washcloth.   Apply the CHG Soap to your body ONLY FROM THE NECK DOWN.  Do not use on open wounds or open sores. Avoid contact with your eyes, ears, mouth and genitals (private parts). Wash Face and genitals (private parts)  with your normal soap.   Wash thoroughly, paying special attention to the area where your surgery will be performed.  Thoroughly rinse your body with warm water from the neck down.  DO NOT shower/wash with your normal soap after using and rinsing off the CHG Soap.  Pat yourself dry with a CLEAN TOWEL.  Wear CLEAN PAJAMAS to bed the night before surgery  Place CLEAN SHEETS on your bed the night before your surgery  DO NOT SLEEP WITH PETS.   Day of Surgery:  Take a shower with CHG soap. Wear Clean/Comfortable clothing the morning of surgery Do not apply any deodorants/lotions.   Remember to brush your teeth WITH YOUR REGULAR TOOTHPASTE.   Please read over the fact sheets that you were given.

## 2022-01-29 ENCOUNTER — Other Ambulatory Visit: Payer: Self-pay

## 2022-01-29 ENCOUNTER — Encounter (HOSPITAL_COMMUNITY)
Admission: RE | Admit: 2022-01-29 | Discharge: 2022-01-29 | Disposition: A | Discharge status: Home / Self Care | Payer: PPO | Source: Ambulatory Visit | Attending: Neurosurgery | Admitting: Neurosurgery

## 2022-01-29 ENCOUNTER — Encounter (HOSPITAL_COMMUNITY): Payer: Self-pay

## 2022-01-29 VITALS — BP 167/82 | HR 65 | Temp 97.5°F | Resp 18 | Ht 63.0 in | Wt 207.7 lb

## 2022-01-29 DIAGNOSIS — Z20822 Contact with and (suspected) exposure to covid-19: Secondary | ICD-10-CM | POA: Insufficient documentation

## 2022-01-29 DIAGNOSIS — Z01812 Encounter for preprocedural laboratory examination: Secondary | ICD-10-CM | POA: Insufficient documentation

## 2022-01-29 DIAGNOSIS — M48062 Spinal stenosis, lumbar region with neurogenic claudication: Secondary | ICD-10-CM | POA: Diagnosis not present

## 2022-01-29 DIAGNOSIS — J310 Chronic rhinitis: Secondary | ICD-10-CM

## 2022-01-29 DIAGNOSIS — Z01818 Encounter for other preprocedural examination: Secondary | ICD-10-CM

## 2022-01-29 HISTORY — DX: Dyspnea, unspecified: R06.00

## 2022-01-29 HISTORY — DX: Nausea with vomiting, unspecified: R11.2

## 2022-01-29 HISTORY — DX: Other specified postprocedural states: Z98.890

## 2022-01-29 HISTORY — DX: Other complications of anesthesia, initial encounter: T88.59XA

## 2022-01-29 LAB — TYPE AND SCREEN
ABO/RH(D): A POS
Antibody Screen: NEGATIVE

## 2022-01-29 LAB — CBC
HCT: 45.2 % (ref 36.0–46.0)
Hemoglobin: 14.2 g/dL (ref 12.0–15.0)
MCH: 29.4 pg (ref 26.0–34.0)
MCHC: 31.4 g/dL (ref 30.0–36.0)
MCV: 93.6 fL (ref 80.0–100.0)
Platelets: 210 10*3/uL (ref 150–400)
RBC: 4.83 MIL/uL (ref 3.87–5.11)
RDW: 13.4 % (ref 11.5–15.5)
WBC: 8.2 10*3/uL (ref 4.0–10.5)
nRBC: 0 % (ref 0.0–0.2)

## 2022-01-29 LAB — SARS CORONAVIRUS 2 BY RT PCR (HOSPITAL ORDER, PERFORMED IN ~~LOC~~ HOSPITAL LAB): SARS Coronavirus 2: NEGATIVE

## 2022-01-29 LAB — SURGICAL PCR SCREEN
MRSA, PCR: NEGATIVE
Staphylococcus aureus: NEGATIVE

## 2022-01-29 NOTE — Progress Notes (Signed)
PCP - Gar Ponto MD Cardiologist - Denies  Chest x-ray - Not indicated EKG - Not indicated Stress Test - A long time ago no f/u needed ECHO - Denies Cardiac Cath - Denies  Sleep Study - Yes no OSA  DM - Denies  COVID TEST- 01/29/22   Anesthesia review: No  Patient denies shortness of breath, fever, cough and chest pain at PAT appointment   All instructions explained to the patient, with a verbal understanding of the material. Patient agrees to go over the instructions while at home for a better understanding. Patient also instructed to wear a mask while in public after being tested for COVID-19. The opportunity to ask questions was provided.

## 2022-02-01 ENCOUNTER — Ambulatory Visit (HOSPITAL_COMMUNITY)
Admission: RE | Admit: 2022-02-01 | Discharge: 2022-02-02 | Disposition: A | Discharge status: Home / Self Care | Payer: PPO | Attending: Neurosurgery | Admitting: Neurosurgery

## 2022-02-01 ENCOUNTER — Encounter (HOSPITAL_COMMUNITY)
Admission: RE | Disposition: A | Discharge status: Home / Self Care | Payer: Self-pay | Source: Home / Self Care | Attending: Neurosurgery

## 2022-02-01 ENCOUNTER — Ambulatory Visit (HOSPITAL_COMMUNITY): Payer: PPO

## 2022-02-01 ENCOUNTER — Ambulatory Visit (HOSPITAL_BASED_OUTPATIENT_CLINIC_OR_DEPARTMENT_OTHER): Payer: PPO

## 2022-02-01 ENCOUNTER — Other Ambulatory Visit: Payer: Self-pay

## 2022-02-01 ENCOUNTER — Encounter (HOSPITAL_COMMUNITY): Payer: Self-pay | Admitting: Neurosurgery

## 2022-02-01 DIAGNOSIS — I739 Peripheral vascular disease, unspecified: Secondary | ICD-10-CM | POA: Insufficient documentation

## 2022-02-01 DIAGNOSIS — Z6836 Body mass index (BMI) 36.0-36.9, adult: Secondary | ICD-10-CM | POA: Insufficient documentation

## 2022-02-01 DIAGNOSIS — J449 Chronic obstructive pulmonary disease, unspecified: Secondary | ICD-10-CM | POA: Diagnosis not present

## 2022-02-01 DIAGNOSIS — M48062 Spinal stenosis, lumbar region with neurogenic claudication: Secondary | ICD-10-CM | POA: Insufficient documentation

## 2022-02-01 DIAGNOSIS — M4716 Other spondylosis with myelopathy, lumbar region: Secondary | ICD-10-CM | POA: Diagnosis not present

## 2022-02-01 DIAGNOSIS — M4316 Spondylolisthesis, lumbar region: Secondary | ICD-10-CM | POA: Diagnosis present

## 2022-02-01 DIAGNOSIS — R2681 Unsteadiness on feet: Secondary | ICD-10-CM | POA: Insufficient documentation

## 2022-02-01 DIAGNOSIS — Z419 Encounter for procedure for purposes other than remedying health state, unspecified: Secondary | ICD-10-CM

## 2022-02-01 DIAGNOSIS — M6281 Muscle weakness (generalized): Secondary | ICD-10-CM | POA: Diagnosis not present

## 2022-02-01 DIAGNOSIS — E669 Obesity, unspecified: Secondary | ICD-10-CM | POA: Diagnosis not present

## 2022-02-01 DIAGNOSIS — M48061 Spinal stenosis, lumbar region without neurogenic claudication: Secondary | ICD-10-CM | POA: Diagnosis not present

## 2022-02-01 DIAGNOSIS — Z981 Arthrodesis status: Secondary | ICD-10-CM | POA: Diagnosis not present

## 2022-02-01 DIAGNOSIS — M5116 Intervertebral disc disorders with radiculopathy, lumbar region: Secondary | ICD-10-CM | POA: Diagnosis not present

## 2022-02-01 DIAGNOSIS — K219 Gastro-esophageal reflux disease without esophagitis: Secondary | ICD-10-CM | POA: Diagnosis not present

## 2022-02-01 DIAGNOSIS — M4726 Other spondylosis with radiculopathy, lumbar region: Secondary | ICD-10-CM | POA: Diagnosis not present

## 2022-02-01 LAB — ABO/RH: ABO/RH(D): A POS

## 2022-02-01 SURGERY — POSTERIOR LUMBAR FUSION 1 LEVEL
Anesthesia: General | Site: Spine Lumbar

## 2022-02-01 MED ORDER — FENTANYL CITRATE (PF) 100 MCG/2ML IJ SOLN
INTRAMUSCULAR | Status: AC
  Filled 2022-02-01: qty 2

## 2022-02-01 MED ORDER — BUPIVACAINE-EPINEPHRINE (PF) 0.5% -1:200000 IJ SOLN
INTRAMUSCULAR | Status: DC | PRN
  Administered 2022-02-01: 10 mL

## 2022-02-01 MED ORDER — SODIUM CHLORIDE 0.9% FLUSH
3.0000 mL | Freq: Two times a day (BID) | INTRAVENOUS | Status: DC
  Administered 2022-02-01: 3 mL via INTRAVENOUS

## 2022-02-01 MED ORDER — SODIUM CHLORIDE 0.9 % IV SOLN
250.0000 mL | INTRAVENOUS | Status: DC
  Administered 2022-02-01: 250 mL via INTRAVENOUS

## 2022-02-01 MED ORDER — LORATADINE 10 MG PO TABS
10.0000 mg | ORAL_TABLET | Freq: Every day | ORAL | Status: DC
Start: 2022-02-01 — End: 2022-02-01

## 2022-02-01 MED ORDER — ACETAMINOPHEN 500 MG PO TABS
1000.0000 mg | ORAL_TABLET | Freq: Four times a day (QID) | ORAL | Status: DC
  Administered 2022-02-01 (×2): 1000 mg via ORAL
  Filled 2022-02-01 (×2): qty 2

## 2022-02-01 MED ORDER — ORAL CARE MOUTH RINSE: 15.0000 mL | Freq: Once | OROMUCOSAL | Status: AC

## 2022-02-01 MED ORDER — EPHEDRINE SULFATE (PRESSORS) 50 MG/ML IJ SOLN
INTRAMUSCULAR | Status: DC | PRN
  Administered 2022-02-01: 5 mg via INTRAVENOUS

## 2022-02-01 MED ORDER — ONDANSETRON HCL 4 MG/2ML IJ SOLN
INTRAMUSCULAR | Status: DC | PRN
  Administered 2022-02-01: 4 mg via INTRAVENOUS

## 2022-02-01 MED ORDER — CEFAZOLIN SODIUM-DEXTROSE 2-4 GM/100ML-% IV SOLN
2.0000 g | Freq: Three times a day (TID) | INTRAVENOUS | Status: AC
  Administered 2022-02-01 (×2): 2 g via INTRAVENOUS
  Filled 2022-02-01 (×2): qty 100

## 2022-02-01 MED ORDER — LATANOPROST 0.005 % OP SOLN
1.0000 [drp] | Freq: Every day | OPHTHALMIC | Status: DC
Start: 2022-02-01 — End: 2022-02-02
  Filled 2022-02-01: qty 2.5

## 2022-02-01 MED ORDER — BACITRACIN ZINC 500 UNIT/GM EX OINT
TOPICAL_OINTMENT | CUTANEOUS | Status: DC | PRN
  Administered 2022-02-01: 1 via TOPICAL

## 2022-02-01 MED ORDER — CHLORHEXIDINE GLUCONATE 0.12 % MT SOLN: 15.0000 mL | Freq: Once | OROMUCOSAL | Status: AC

## 2022-02-01 MED ORDER — CEFAZOLIN SODIUM-DEXTROSE 2-4 GM/100ML-% IV SOLN
INTRAVENOUS | Status: AC
  Filled 2022-02-01: qty 100

## 2022-02-01 MED ORDER — SUGAMMADEX SODIUM 200 MG/2ML IV SOLN
INTRAVENOUS | Status: DC | PRN
  Administered 2022-02-01: 200 mg via INTRAVENOUS

## 2022-02-01 MED ORDER — ROCURONIUM BROMIDE 100 MG/10ML IV SOLN
INTRAVENOUS | Status: DC | PRN
  Administered 2022-02-01: 20 mg via INTRAVENOUS
  Administered 2022-02-01: 60 mg via INTRAVENOUS

## 2022-02-01 MED ORDER — LIDOCAINE HCL (CARDIAC) PF 100 MG/5ML IV SOSY
PREFILLED_SYRINGE | INTRAVENOUS | Status: DC | PRN
  Administered 2022-02-01: 60 mg via INTRAVENOUS

## 2022-02-01 MED ORDER — MENTHOL 3 MG MT LOZG: 1.0000 | LOZENGE | OROMUCOSAL | Status: DC | PRN

## 2022-02-01 MED ORDER — HYDROXYZINE HCL 50 MG/ML IM SOLN
50.0000 mg | Freq: Four times a day (QID) | INTRAMUSCULAR | Status: DC | PRN
Start: 2022-02-01 — End: 2022-02-02
  Administered 2022-02-01: 50 mg via INTRAMUSCULAR
  Filled 2022-02-01: qty 1

## 2022-02-01 MED ORDER — CHLORHEXIDINE GLUCONATE CLOTH 2 % EX PADS: 6.0000 | MEDICATED_PAD | Freq: Once | CUTANEOUS | Status: DC

## 2022-02-01 MED ORDER — BUPIVACAINE-EPINEPHRINE 0.5% -1:200000 IJ SOLN
INTRAMUSCULAR | Status: AC
  Filled 2022-02-01: qty 1

## 2022-02-01 MED ORDER — DEXAMETHASONE SODIUM PHOSPHATE 10 MG/ML IJ SOLN
INTRAMUSCULAR | Status: DC | PRN
  Administered 2022-02-01: 10 mg via INTRAVENOUS

## 2022-02-01 MED ORDER — DEXAMETHASONE SODIUM PHOSPHATE 10 MG/ML IJ SOLN
INTRAMUSCULAR | Status: AC
  Filled 2022-02-01: qty 1

## 2022-02-01 MED ORDER — TRAMADOL HCL 50 MG PO TABS: 50.0000 mg | ORAL_TABLET | Freq: Four times a day (QID) | ORAL | Status: DC | PRN

## 2022-02-01 MED ORDER — BISACODYL 10 MG RE SUPP
10.0000 mg | Freq: Every day | RECTAL | Status: DC | PRN
Start: 2022-02-01 — End: 2022-02-02

## 2022-02-01 MED ORDER — ONDANSETRON HCL 4 MG/2ML IJ SOLN
INTRAMUSCULAR | Status: AC
  Filled 2022-02-01: qty 2

## 2022-02-01 MED ORDER — MOMETASONE FURO-FORMOTEROL FUM 200-5 MCG/ACT IN AERO
2.0000 | INHALATION_SPRAY | Freq: Two times a day (BID) | RESPIRATORY_TRACT | Status: DC
  Filled 2022-02-01: qty 8.8

## 2022-02-01 MED ORDER — LACTATED RINGERS IV SOLN: INTRAVENOUS | Status: DC

## 2022-02-01 MED ORDER — ACETAMINOPHEN 325 MG PO TABS
650.0000 mg | ORAL_TABLET | ORAL | Status: DC | PRN
  Administered 2022-02-01 – 2022-02-02 (×3): 650 mg via ORAL
  Filled 2022-02-01 (×3): qty 2

## 2022-02-01 MED ORDER — PROPOFOL 10 MG/ML IV BOLUS
INTRAVENOUS | Status: DC | PRN
  Administered 2022-02-01: 120 mg via INTRAVENOUS

## 2022-02-01 MED ORDER — ONDANSETRON HCL 4 MG/2ML IJ SOLN: 4.0000 mg | Freq: Four times a day (QID) | INTRAMUSCULAR | Status: DC | PRN

## 2022-02-01 MED ORDER — ALBUTEROL SULFATE (2.5 MG/3ML) 0.083% IN NEBU: 2.5000 mg | INHALATION_SOLUTION | Freq: Four times a day (QID) | RESPIRATORY_TRACT | Status: DC | PRN

## 2022-02-01 MED ORDER — ROSUVASTATIN CALCIUM 5 MG PO TABS
5.0000 mg | ORAL_TABLET | Freq: Every day | ORAL | Status: DC
  Administered 2022-02-01: 5 mg via ORAL
  Filled 2022-02-01: qty 1

## 2022-02-01 MED ORDER — SODIUM CHLORIDE 0.9% FLUSH: 3.0000 mL | INTRAVENOUS | Status: DC | PRN

## 2022-02-01 MED ORDER — DOCUSATE SODIUM 100 MG PO CAPS
100.0000 mg | ORAL_CAPSULE | Freq: Two times a day (BID) | ORAL | Status: DC
  Administered 2022-02-01: 100 mg via ORAL
  Filled 2022-02-01: qty 1

## 2022-02-01 MED ORDER — OXYCODONE HCL 5 MG/5ML PO SOLN: 5.0000 mg | Freq: Once | ORAL | Status: DC | PRN

## 2022-02-01 MED ORDER — OXYCODONE HCL 5 MG PO TABS: 5.0000 mg | ORAL_TABLET | Freq: Once | ORAL | Status: DC | PRN

## 2022-02-01 MED ORDER — CYCLOBENZAPRINE HCL 10 MG PO TABS
10.0000 mg | ORAL_TABLET | Freq: Three times a day (TID) | ORAL | Status: DC | PRN
  Administered 2022-02-01: 10 mg via ORAL
  Filled 2022-02-01: qty 1

## 2022-02-01 MED ORDER — CEFAZOLIN SODIUM-DEXTROSE 2-4 GM/100ML-% IV SOLN
2.0000 g | INTRAVENOUS | Status: AC
  Administered 2022-02-01: 2 g via INTRAVENOUS

## 2022-02-01 MED ORDER — OXYCODONE HCL 5 MG PO TABS: 5.0000 mg | ORAL_TABLET | ORAL | Status: DC | PRN

## 2022-02-01 MED ORDER — BUPIVACAINE LIPOSOME 1.3 % IJ SUSP
INTRAMUSCULAR | Status: AC
  Filled 2022-02-01: qty 20

## 2022-02-01 MED ORDER — THROMBIN 5000 UNITS EX SOLR
OROMUCOSAL | Status: DC | PRN
  Administered 2022-02-01: 5 mL via TOPICAL

## 2022-02-01 MED ORDER — FENTANYL CITRATE (PF) 100 MCG/2ML IJ SOLN
INTRAMUSCULAR | Status: DC | PRN
Start: 2022-02-01 — End: 2022-02-01
  Administered 2022-02-01 (×3): 50 ug via INTRAVENOUS

## 2022-02-01 MED ORDER — BUPIVACAINE LIPOSOME 1.3 % IJ SUSP
INTRAMUSCULAR | Status: DC | PRN
  Administered 2022-02-01: 20 mL

## 2022-02-01 MED ORDER — ALBUTEROL SULFATE HFA 108 (90 BASE) MCG/ACT IN AERS: 2.0000 | INHALATION_SPRAY | Freq: Four times a day (QID) | RESPIRATORY_TRACT | Status: DC | PRN

## 2022-02-01 MED ORDER — ONDANSETRON HCL 4 MG PO TABS: 4.0000 mg | ORAL_TABLET | Freq: Four times a day (QID) | ORAL | Status: DC | PRN

## 2022-02-01 MED ORDER — ZOLPIDEM TARTRATE 5 MG PO TABS: 5.0000 mg | ORAL_TABLET | Freq: Every evening | ORAL | Status: DC | PRN

## 2022-02-01 MED ORDER — PHENYLEPHRINE HCL-NACL 20-0.9 MG/250ML-% IV SOLN
INTRAVENOUS | Status: DC | PRN
  Administered 2022-02-01: 30 ug/min via INTRAVENOUS

## 2022-02-01 MED ORDER — CHLORHEXIDINE GLUCONATE CLOTH 2 % EX PADS
6.0000 | MEDICATED_PAD | Freq: Once | CUTANEOUS | Status: DC
Start: 2022-02-01 — End: 2022-02-01

## 2022-02-01 MED ORDER — MORPHINE SULFATE (PF) 4 MG/ML IV SOLN: 4.0000 mg | INTRAVENOUS | Status: DC | PRN

## 2022-02-01 MED ORDER — LACTATED RINGERS IV SOLN: INTRAVENOUS | Status: DC | PRN

## 2022-02-01 MED ORDER — BACITRACIN ZINC 500 UNIT/GM EX OINT
TOPICAL_OINTMENT | CUTANEOUS | Status: AC
  Filled 2022-02-01: qty 28.35

## 2022-02-01 MED ORDER — FENTANYL CITRATE (PF) 100 MCG/2ML IJ SOLN
25.0000 ug | INTRAMUSCULAR | Status: DC | PRN
  Administered 2022-02-01 (×2): 50 ug via INTRAVENOUS

## 2022-02-01 MED ORDER — OXYCODONE HCL 5 MG PO TABS
10.0000 mg | ORAL_TABLET | ORAL | Status: DC | PRN
  Administered 2022-02-01 – 2022-02-02 (×3): 10 mg via ORAL
  Administered 2022-02-02: 5 mg via ORAL
  Filled 2022-02-01 (×4): qty 2

## 2022-02-01 MED ORDER — CHLORHEXIDINE GLUCONATE 0.12 % MT SOLN
OROMUCOSAL | Status: AC
  Administered 2022-02-01: 15 mL via OROMUCOSAL
  Filled 2022-02-01: qty 15

## 2022-02-01 MED ORDER — PANTOPRAZOLE SODIUM 40 MG PO TBEC
80.0000 mg | DELAYED_RELEASE_TABLET | Freq: Every day | ORAL | Status: DC
Start: 2022-02-01 — End: 2022-02-01

## 2022-02-01 MED ORDER — AMISULPRIDE (ANTIEMETIC) 5 MG/2ML IV SOLN
INTRAVENOUS | Status: AC
  Filled 2022-02-01: qty 4

## 2022-02-01 MED ORDER — 0.9 % SODIUM CHLORIDE (POUR BTL) OPTIME
TOPICAL | Status: DC | PRN
  Administered 2022-02-01: 1000 mL

## 2022-02-01 MED ORDER — LIDOCAINE 2% (20 MG/ML) 5 ML SYRINGE
INTRAMUSCULAR | Status: AC
  Filled 2022-02-01: qty 5

## 2022-02-01 MED ORDER — FENTANYL CITRATE (PF) 250 MCG/5ML IJ SOLN
INTRAMUSCULAR | Status: AC
  Filled 2022-02-01: qty 5

## 2022-02-01 MED ORDER — DORZOLAMIDE HCL-TIMOLOL MAL 2-0.5 % OP SOLN
1.0000 [drp] | Freq: Two times a day (BID) | OPHTHALMIC | Status: DC
  Filled 2022-02-01: qty 10

## 2022-02-01 MED ORDER — ACETAMINOPHEN 650 MG RE SUPP: 650.0000 mg | RECTAL | Status: DC | PRN

## 2022-02-01 MED ORDER — PROPOFOL 10 MG/ML IV BOLUS
INTRAVENOUS | Status: AC
  Filled 2022-02-01: qty 20

## 2022-02-01 MED ORDER — PHENOL 1.4 % MT LIQD: 1.0000 | OROMUCOSAL | Status: DC | PRN

## 2022-02-01 MED ORDER — AMISULPRIDE (ANTIEMETIC) 5 MG/2ML IV SOLN
10.0000 mg | Freq: Once | INTRAVENOUS | Status: AC
  Administered 2022-02-01: 10 mg via INTRAVENOUS

## 2022-02-01 MED ORDER — THROMBIN 5000 UNITS EX SOLR
CUTANEOUS | Status: AC
  Filled 2022-02-01: qty 5000

## 2022-02-01 SURGICAL SUPPLY — 63 items
APL SKNCLS STERI-STRIP NONHPOA (GAUZE/BANDAGES/DRESSINGS) ×1
BAG COUNTER SPONGE SURGICOUNT (BAG) ×2 IMPLANT
BAG SPNG CNTER NS LX DISP (BAG) ×1
BASKET BONE COLLECTION (BASKET) ×2 IMPLANT
BENZOIN TINCTURE PRP APPL 2/3 (GAUZE/BANDAGES/DRESSINGS) ×2 IMPLANT
BUR MATCHSTICK NEURO 3.0 LAGG (BURR) ×2 IMPLANT
BUR PRECISION FLUTE 6.0 (BURR) ×2 IMPLANT
CANISTER SUCT 3000ML PPV (MISCELLANEOUS) ×2 IMPLANT
CAP LOCK DLX THRD (Cap) ×4 IMPLANT
CARTRIDGE OIL MAESTRO DRILL (MISCELLANEOUS) ×1 IMPLANT
CNTNR URN SCR LID CUP LEK RST (MISCELLANEOUS) ×1 IMPLANT
CONT SPEC 4OZ STRL OR WHT (MISCELLANEOUS) ×2
COVER BACK TABLE 60X90IN (DRAPES) ×2 IMPLANT
DIFFUSER DRILL AIR PNEUMATIC (MISCELLANEOUS) ×2 IMPLANT
DRAPE C-ARM 42X72 X-RAY (DRAPES) ×4 IMPLANT
DRAPE HALF SHEET 40X57 (DRAPES) ×2 IMPLANT
DRAPE LAPAROTOMY 100X72X124 (DRAPES) ×2 IMPLANT
DRAPE SURG 17X23 STRL (DRAPES) ×8 IMPLANT
DRSG OPSITE POSTOP 4X6 (GAUZE/BANDAGES/DRESSINGS) ×2 IMPLANT
ELECT BLADE 4.0 EZ CLEAN MEGAD (MISCELLANEOUS) ×2
ELECT REM PT RETURN 9FT ADLT (ELECTROSURGICAL) ×2
ELECTRODE BLDE 4.0 EZ CLN MEGD (MISCELLANEOUS) ×1 IMPLANT
ELECTRODE REM PT RTRN 9FT ADLT (ELECTROSURGICAL) ×1 IMPLANT
EVACUATOR 1/8 PVC DRAIN (DRAIN) IMPLANT
GAUZE 4X4 16PLY ~~LOC~~+RFID DBL (SPONGE) ×2 IMPLANT
GLOVE EXAM NITRILE XL STR (GLOVE) IMPLANT
GLOVE SURG ENC MOIS LTX SZ8 (GLOVE) ×4 IMPLANT
GLOVE SURG ENC MOIS LTX SZ8.5 (GLOVE) ×4 IMPLANT
GOWN STRL REUS W/ TWL LRG LVL3 (GOWN DISPOSABLE) IMPLANT
GOWN STRL REUS W/ TWL XL LVL3 (GOWN DISPOSABLE) ×2 IMPLANT
GOWN STRL REUS W/TWL 2XL LVL3 (GOWN DISPOSABLE) IMPLANT
GOWN STRL REUS W/TWL LRG LVL3 (GOWN DISPOSABLE)
GOWN STRL REUS W/TWL XL LVL3 (GOWN DISPOSABLE) ×4
HEMOSTAT POWDER KIT SURGIFOAM (HEMOSTASIS) ×2 IMPLANT
KIT BASIN OR (CUSTOM PROCEDURE TRAY) ×2 IMPLANT
KIT GRAFTMAG DEL NEURO DISP (NEUROSURGERY SUPPLIES) ×1 IMPLANT
KIT TURNOVER KIT B (KITS) ×2 IMPLANT
MILL MEDIUM DISP (BLADE) ×2 IMPLANT
NDL HYPO 21X1.5 SAFETY (NEEDLE) IMPLANT
NEEDLE HYPO 21X1.5 SAFETY (NEEDLE) IMPLANT
NEEDLE HYPO 22GX1.5 SAFETY (NEEDLE) ×2 IMPLANT
NS IRRIG 1000ML POUR BTL (IV SOLUTION) ×2 IMPLANT
OIL CARTRIDGE MAESTRO DRILL (MISCELLANEOUS) ×2
PACK LAMINECTOMY NEURO (CUSTOM PROCEDURE TRAY) ×2 IMPLANT
PAD ARMBOARD 7.5X6 YLW CONV (MISCELLANEOUS) ×6 IMPLANT
PATTIES SURGICAL .5 X1 (DISPOSABLE) IMPLANT
PUTTY DBM 10CC CALC GRAN (Putty) ×1 IMPLANT
ROD CREO DLX CVD 6.35X40 (Rod) IMPLANT
ROD CURVED TI 6.35X40 (Rod) ×4 IMPLANT
SCREW PA DLX CREO 7.5X50 (Screw) ×4 IMPLANT
SPACER ALTERA 10X31 8-12MM-8 (Spacer) ×1 IMPLANT
SPONGE NEURO XRAY DETECT 1X3 (DISPOSABLE) IMPLANT
SPONGE SURGIFOAM ABS GEL 100 (HEMOSTASIS) IMPLANT
SPONGE T-LAP 4X18 ~~LOC~~+RFID (SPONGE) ×1 IMPLANT
STRIP CLOSURE SKIN 1/2X4 (GAUZE/BANDAGES/DRESSINGS) ×2 IMPLANT
SUT VIC AB 1 CT1 18XBRD ANBCTR (SUTURE) ×2 IMPLANT
SUT VIC AB 1 CT1 8-18 (SUTURE) ×2
SUT VIC AB 2-0 CP2 18 (SUTURE) ×4 IMPLANT
SYR 20ML LL LF (SYRINGE) ×2 IMPLANT
TOWEL GREEN STERILE (TOWEL DISPOSABLE) ×2 IMPLANT
TOWEL GREEN STERILE FF (TOWEL DISPOSABLE) ×2 IMPLANT
TRAY FOLEY MTR SLVR 16FR STAT (SET/KITS/TRAYS/PACK) ×2 IMPLANT
WATER STERILE IRR 1000ML POUR (IV SOLUTION) ×2 IMPLANT

## 2022-02-01 NOTE — Op Note (Signed)
Brief history: The patient is a 71 year old white female who has complained of back and bilateral leg pain consistent with neurogenic claudication.  She has failed medical management and was worked up with a lumbar MRI lumbar x-rays which demonstrated an L3-4 spondylolisthesis with spinal stenosis.  I discussed the various treatment options.  She has decided proceed with surgery. ? ?Preoperative diagnosis: L3-4 spondylolisthesis, facet arthropathy, degenerative disc disease, spinal stenosis compressing both the L3 and the L4 nerve roots; lumbago; lumbar radiculopathy; neurogenic claudication ? ?Postoperative diagnosis: The same ? ?Procedure: Bilateral L3-4 laminotomy/foraminotomies/medial facetectomy to decompress the bilateral L3 and L4 nerve roots(the work required to do this was in addition to the work required to do the posterior lumbar interbody fusion because of the patient's spinal stenosis, facet arthropathy. Etc. requiring a wide decompression of the nerve roots.);  Right L3-4 transforaminal lumbar interbody fusion with local morselized autograft bone and Zimmer DBM; insertion of interbody prosthesis at L3-4 (globus peek expandable interbody prosthesis); posterior nonsegmental instrumentation from L3 to L4 with globus titanium pedicle screws and rods; posterior lateral arthrodesis at L3-4 with local morselized autograft bone and Zimmer DBM. ? ?Surgeon: Dr. Earle Gell ? ?Asst.: Arnetha Massy, NP ? ?Anesthesia: Gen. endotracheal ? ?Estimated blood loss: 200 cc ? ?Drains: None ? ?Complications: None ? ?Description of procedure: The patient was brought to the operating room by the anesthesia team. General endotracheal anesthesia was induced. The patient was turned to the prone position on the Wilson frame. The patient's lumbosacral region was then prepared with Betadine scrub and Betadine solution. Sterile drapes were applied. ? ?I then injected the area to be incised with Marcaine with epinephrine solution.  I then used the scalpel to make a linear midline incision over the L3-4 interspace. I then used electrocautery to perform a bilateral subperiosteal dissection exposing the spinous process and lamina of L3-4. We then obtained intraoperative radiograph to confirm our location. We then inserted the Verstrac retractor to provide exposure. ? ?I began the decompression by using the high speed drill to perform laminotomies at L3-4 bilaterally. We then used the Kerrison punches to widen the laminotomy and removed the ligamentum flavum at L3-4 bilaterally. We used the Kerrison punches to remove the medial facets at L3-4 bilaterally, we removed the right L3-4 facet. We performed wide foraminotomies about the bilateral L3 and L4 nerve roots completing the decompression. ? ?We now turned our attention to the posterior lumbar interbody fusion. I used a scalpel to incise the intervertebral disc at L3-4 bilaterally. I then performed a partial intervertebral discectomy at L3-4 bilaterally using the pituitary forceps. We prepared the vertebral endplates at O9-7 bilaterally for the fusion by removing the soft tissues with the curettes. We then used the trial spacers to pick the appropriate sized interbody prosthesis. We prefilled his prosthesis with a combination of local morselized autograft bone that we obtained during the decompression as well as Zimmer DBM. We inserted the prefilled prosthesis into the interspace at L3-4 from the right, we then turned and expanded the prosthesis. There was a good snug fit of the prosthesis in the interspace. We then filled and the remainder of the intervertebral disc space with local morselized autograft bone and Zimmer DBM. This completed the posterior lumbar interbody arthrodesis.  During the decompression and insertion of the prosthesis the assistant protected the thecal sac and nerve roots with the D'Errico retractor. ? ?We now turned attention to the instrumentation. Under fluoroscopic  guidance we cannulated the bilateral L3 and L4 pedicles with  the bone probe. We then removed the bone probe. We then tapped the pedicle with a 6.5 millimeter tap. We then removed the tap. We probed inside the tapped pedicle with a ball probe to rule out cortical breaches. We then inserted a 7.5 x 50 millimeter pedicle screw into the L3 and L4 pedicles bilaterally under fluoroscopic guidance. We then palpated along the medial aspect of the pedicles to rule out cortical breaches. There were none. The nerve roots were not injured. We then connected the unilateral pedicle screws with a lordotic rod. We compressed the construct and secured the rod in place with the caps. We then tightened the caps appropriately. This completed the instrumentation from L3-4. ? ?We now turned our attention to the posterior lateral arthrodesis at L3-4. We used the high-speed drill to decorticate the remainder of the facets, pars, transverse process at L3-4. We then applied a combination of local morselized autograft bone and Zimmer DBM over these decorticated posterior lateral structures. This completed the posterior lateral arthrodesis. ? ?We then obtained hemostasis using bipolar electrocautery. We irrigated the wound out with bacitracin solution. We inspected the thecal sac and nerve roots and noted they were well decompressed. We then removed the retractor.  We injected Exparel . We reapproximated patient's thoracolumbar fascia with interrupted #1 Vicryl suture. We reapproximated patient's subcutaneous tissue with interrupted 2-0 Vicryl suture. The reapproximated patient's skin with Steri-Strips and benzoin. The wound was then coated with bacitracin ointment. A sterile dressing was applied. The drapes were removed. The patient was subsequently returned to the supine position where they were extubated by the anesthesia team. He was then transported to the post anesthesia care unit in stable condition. All sponge instrument and needle  counts were reportedly correct at the end of this case. ? ? ? ? ? ?

## 2022-02-01 NOTE — Anesthesia Procedure Notes (Addendum)
Procedure Name: Intubation ?Date/Time: 02/01/2022 7:45 AM ?Performed by: Gwyndolyn Saxon, CRNA ?Pre-anesthesia Checklist: Patient identified, Emergency Drugs available, Suction available and Patient being monitored ?Patient Re-evaluated:Patient Re-evaluated prior to induction ?Oxygen Delivery Method: Circle System Utilized ?Preoxygenation: Pre-oxygenation with 100% oxygen ?Induction Type: IV induction ?Ventilation: Mask ventilation without difficulty ?Laryngoscope Size: Glidescope ?Grade View: Grade I ?Tube type: Oral ?Tube size: 7.0 mm ?Number of attempts: 2 ?Airway Equipment and Method: Stylet and Oral airway ?Placement Confirmation: ETT inserted through vocal cords under direct vision, positive ETCO2 and breath sounds checked- equal and bilateral ?Secured at: 21 cm ?Tube secured with: Tape ?Dental Injury: Teeth and Oropharynx as per pre-operative assessment  ?Difficulty Due To: Difficulty was unanticipated ?Comments: SRNA DL x1, cuff inflated too shallow. DL x1 with Glide by SRNA with +BBS, EtCO2 ? ? ? ? ?

## 2022-02-01 NOTE — Transfer of Care (Signed)
Immediate Anesthesia Transfer of Care Note  Patient: Tami Dickson  Procedure(s) Performed: PLIF L34, IP,POSTERIOR INSTRUMENTATION (Spine Lumbar)  Patient Location: PACU  Anesthesia Type:General  Level of Consciousness: drowsy and patient cooperative  Airway & Oxygen Therapy: Patient Spontanous Breathing  Post-op Assessment: Report given to RN and Post -op Vital signs reviewed and stable  Post vital signs: stable  Last Vitals:  Vitals Value Taken Time  BP 163/75 02/01/22 1036  Temp    Pulse 71 02/01/22 1038  Resp 18 02/01/22 1038  SpO2 100 % 02/01/22 1038  Vitals shown include unvalidated device data.  Last Pain:  Vitals:   02/01/22 0630  TempSrc:   PainSc: 0-No pain         Complications: No notable events documented.

## 2022-02-01 NOTE — H&P (Signed)
Subjective: ?The patient is a 71 year old obese white female who has complained of back and right greater left leg pain consistent with neurogenic claudication.  She has failed medical management and was worked up with lumbar x-rays and lumbar MRI which demonstrated an L3-4 spinal listhesis and spinal stenosis.  I discussed the various treatment options with her.  She has decided proceed with surgery. ? ?Past Medical History:  ?Diagnosis Date  ? Complication of anesthesia   ? COPD (chronic obstructive pulmonary disease) ([place])   ? Dyspnea   ? GERD (gastroesophageal reflux disease)   ? Glaucoma   ? Peripheral vascular disease ([place])   ? PONV (postoperative nausea and vomiting)   ?  ?Past Surgical History:  ?Procedure Laterality Date  ? ABDOMINAL HYSTERECTOMY    ? EYE SURGERY Bilateral   ? ROTATOR CUFF REPAIR    ? TONSILLECTOMY    ? WRIST ARTHROSCOPY Left 06/30/2020  ? Procedure: ARTHROSCOPY WRIST POSSIBLE DEBRIDEMENT; OPEN STABILIZATION EXSTENSOR CARPI ULNARIS; DEEPENING ULNAR GROOVE;  Surgeon: Daryll Brod, MD;  Location: [facility];  Service: Orthopedics;  Laterality: Left;  ? WRIST ARTHROSCOPY WITH FOVEAL TRIANGULAR FIBROCARTILAGE COMPLEX REPAIR Left 06/30/2020  ? Procedure: WRIST ARTHROSCOPY WITH LUNOTRIQUETRAL/TRIANGULAR FIBROCARTILAGE COMPLEX REPAIR;  Surgeon: Daryll Brod, MD;  Location: [facility];  Service: Orthopedics;  Laterality: Left;  AXILLARY BLOCK  ?  ?Allergies  ?Allergen Reactions  ? Atorvastatin Other (See Comments)  ?  Joint pain ?  ? Codeine Nausea And Vomiting  ? Other   ?  Pt states it was something that was put over her mouth; believes it was ether  ? Statins Other (See Comments)  ?  Joints hurt  ?  ?Social History  ? ?Tobacco Use  ? Smoking status: Never  ? Smokeless tobacco: Never  ?Substance Use Topics  ? Alcohol use: Not Currently  ?  ?Family History  ?Problem Relation Age of Onset  ? Heart disease Mother   ? Varicose Veins Mother   ? Heart disease Father   ?  Varicose Veins Sister   ? Hypertension Sister   ? Allergic rhinitis Neg Hx   ? Asthma Neg Hx   ? ?Prior to Admission medications   ?Medication Sig Start Date End Date Taking? Authorizing Provider  ?albuterol (ACCUNEB) 0.63 MG/3ML nebulizer solution Take 1 ampule by nebulization every 6 (six) hours as needed for shortness of breath or wheezing.   Yes [provider]  ?albuterol (PROVENTIL HFA;VENTOLIN HFA) 108 (90 Base) MCG/ACT inhaler Inhale 2 puffs into the lungs every 6 (six) hours as needed for wheezing or shortness of breath. 01/16/19  Yes Valentina Shaggy, MD  ?celecoxib (CELEBREX) 200 MG capsule Take 200 mg by mouth daily. 01/10/22  Yes [provider]  ?dorzolamide-timolol (COSOPT) 22.3-6.8 MG/ML ophthalmic solution Place 1 drop into both eyes 2 (two) times daily. 04/30/18  Yes [provider]  ?erythromycin ophthalmic ointment Place 1 application into both eyes at bedtime. 08/26/18  Yes [provider]  ?esomeprazole (NEXIUM) 40 MG capsule Take 40 mg by mouth daily. 12/27/21  Yes [provider]  ?rosuvastatin (CRESTOR) 5 MG tablet Take 5 mg by mouth at bedtime. 12/11/21  Yes [provider]  ?SYMBICORT 160-4.5 MCG/ACT inhaler Inhale 2 puffs into the lungs 2 (two) times daily. 08/21/19  Yes Valentina Shaggy, MD  ?traMADol (ULTRAM) 50 MG tablet Take 1 tablet (50 mg total) by mouth every 6 (six) hours as needed. 06/30/20  Yes Daryll Brod, MD  ?Travoprost,  BAK Free, (TRAVATAN) 0.004 % SOLN ophthalmic solution Place 1 drop into both eyes at bedtime. 12/23/21  Yes [provider]  ?azelastine (ASTELIN) 0.1 % nasal spray Place 2 sprays into both nostrils 2 (two) times daily as needed for rhinitis. Use in each nostril as directed ?Patient not taking: Reported on 01/24/2022 01/16/19   Valentina Shaggy, MD  ?famotidine (PEPCID) 40 MG tablet Take 1 tablet (40 mg total) by mouth 2 (two) times a day. ?Patient taking differently: Take 40 mg by mouth 2 (two)  times daily as needed for heartburn or indigestion. 04/22/19   Valentina Shaggy, MD  ?fexofenadine (ALLEGRA) 180 MG tablet Take 1 tablet (180 mg total) by mouth daily. ?Patient taking differently: Take 180 mg by mouth daily as needed for allergies. 01/16/19   Valentina Shaggy, MD  ?omeprazole (PRILOSEC) 20 MG capsule Take 1 capsule (20 mg total) by mouth daily for 30 days. ?Patient not taking: Reported on 01/24/2022 03/25/19 04/24/19  Valentina Shaggy, MD  ?triamcinolone (NASACORT) 55 MCG/ACT AERO nasal inhaler Place 1 spray into the nose daily. ?Patient not taking: Reported on 01/24/2022 08/21/19   Valentina Shaggy, MD  ?valACYclovir (VALTREX) 1000 MG tablet Take 2,000 mg by mouth 2 (two) times daily as needed (fever blisters). 12/27/21   [provider]  ? ?  ?Review of Systems ? ?Positive ROS: As above ? ?All other systems have been reviewed and were otherwise negative with the exception of those mentioned in the HPI and as above. ? ?Objective: ?Vital signs in last 24 hours: ?Temp:  [98.1 ?F (36.7 ?C)] 98.1 ?F (36.7 ?C) (03/02 0932) ?Pulse Rate:  [67] 67 (03/02 3557) ?Resp:  [17] 17 (03/02 3220) ?BP: (180)/(69) 180/69 (03/02 2542) ?SpO2:  [99 %] 99 % (03/02 7062) ?Weight:  [93.9 kg] 93.9 kg (03/02 3762) ?Estimated body mass index is 36.67 kg/m? as calculated from the following: ?  Height as of this encounter: 5\' 3"  (1.6 m). ?  Weight as of this encounter: 93.9 kg. ? ? ?General Appearance: Alert ?Head: Normocephalic, without obvious abnormality, atraumatic ?Eyes: PERRL, conjunctiva/corneas clear, EOM's intact,    ?Ears: Normal  ?Throat: Normal  ?Neck: Supple, ?Back: unremarkable ?Lungs: Clear to auscultation bilaterally, respirations unlabored ?Heart: Regular rate and rhythm, no murmur, rub or gallop ?Abdomen: Soft, non-tender ?Extremities: Extremities normal, atraumatic, no cyanosis or edema ?Skin: unremarkable ? ?NEUROLOGIC:  ? ?Mental status: alert and oriented,Motor Exam - grossly  normal ?Sensory Exam - grossly normal ?Reflexes:  ?Coordination - grossly normal ?Gait - grossly normal ?Balance - grossly normal ?Cranial Nerves: ?I: smell Not tested  ?II: visual acuity  OS: Normal  OD: Normal   ?II: visual fields Full to confrontation  ?II: pupils Equal, round, reactive to light  ?III,VII: ptosis None  ?III,IV,VI: extraocular muscles  Full ROM  ?V: mastication Normal  ?V: facial light touch sensation  Normal  ?V,VII: corneal reflex  Present  ?VII: facial muscle function - upper  Normal  ?VII: facial muscle function - lower Normal  ?VIII: hearing Not tested  ?IX: soft palate elevation  Normal  ?IX,X: gag reflex Present  ?XI: trapezius strength  5/5  ?XI: sternocleidomastoid strength 5/5  ?XI: neck flexion strength  5/5  ?XII: tongue strength  Normal  ? ? ?Data Review ?Lab Results  ?Component Value Date  ? WBC 8.2 01/29/2022  ? HGB 14.2 01/29/2022  ? HCT 45.2 01/29/2022  ? MCV 93.6 01/29/2022  ? PLT 210 01/29/2022  ? ?No results found  for: NA, K, CL, CO2, BUN, CREATININE, GLUCOSE ?No results found for: INR, PROTIME ? ?Assessment/Plan: ?L3-4 spinal listhesis, spinal stenosis, lumbago, lumbar radiculopathy, neurogenic claudication: I have discussed situation with the patient.  I reviewed her imaging studies with her and pointed out the abnormalities.  We have discussed the various treatment options including surgery.  I have described the surgical treatment option of an L3-4 decompression, instrumentation and fusion.  I have shown her surgical models.  I have given her a surgical pamphlet.  We have discussed the risk, benefits, alternatives, expected postop course, and likelihood of achieving our goals with surgery.  I have answered all her questions.  She has decided proceed with surgery. ? ? ?[name] ?02/01/2022 7:26 AM ? ?  ? ? ?

## 2022-02-01 NOTE — Anesthesia Preprocedure Evaluation (Signed)
Anesthesia Evaluation  ?Patient identified by MRN, date of birth, ID band ?Patient awake ? ? ? ?Reviewed: ?Allergy & Precautions, H&P , NPO status , Patient's Chart, lab work & pertinent test results ? ?History of Anesthesia Complications ?(+) PONV and history of anesthetic complications ? ?Airway ?Mallampati: II ? ? ?Neck ROM: full ? ? ? Dental ?  ?Pulmonary ?shortness of breath, asthma , COPD,  ?  ?breath sounds clear to auscultation ? ? ? ? ? ? Cardiovascular ?+ Peripheral Vascular Disease  ? ?Rhythm:regular Rate:Normal ? ? ?  ?Neuro/Psych ?  ? GI/Hepatic ?GERD  ,  ?Endo/Other  ? ? Renal/GU ?  ? ?  ?Musculoskeletal ? ? Abdominal ?  ?Peds ? Hematology ?  ?Anesthesia Other Findings ? ? Reproductive/Obstetrics ? ?  ? ? ? ? ? ? ? ? ? ? ? ? ? ?  ?  ? ? ? ? ? ? ? ? ?Anesthesia Physical ?Anesthesia Plan ? ?ASA: 2 ? ?Anesthesia Plan: General  ? ?Post-op Pain Management:   ? ?Induction: Intravenous ? ?PONV Risk Score and Plan: 4 or greater and Ondansetron, Dexamethasone and Treatment may vary due to age or medical condition ? ?Airway Management Planned: Oral ETT ? ?Additional Equipment:  ? ?Intra-op Plan:  ? ?Post-operative Plan: Extubation in OR ? ?Informed Consent: I have reviewed the patients History and Physical, chart, labs and discussed the procedure including the risks, benefits and alternatives for the proposed anesthesia with the patient or authorized representative who has indicated his/her understanding and acceptance.  ? ? ? ?Dental advisory given ? ?Plan Discussed with: CRNA, Anesthesiologist and Surgeon ? ?Anesthesia Plan Comments:   ? ? ? ? ? ? ?Anesthesia Quick Evaluation ? ?

## 2022-02-01 NOTE — Plan of Care (Signed)
WNL

## 2022-02-02 DIAGNOSIS — M4726 Other spondylosis with radiculopathy, lumbar region: Secondary | ICD-10-CM | POA: Diagnosis not present

## 2022-02-02 LAB — CBC
HCT: 35.6 % — ABNORMAL LOW (ref 36.0–46.0)
Hemoglobin: 11.7 g/dL — ABNORMAL LOW (ref 12.0–15.0)
MCH: 29.8 pg (ref 26.0–34.0)
MCHC: 32.9 g/dL (ref 30.0–36.0)
MCV: 90.8 fL (ref 80.0–100.0)
Platelets: 206 10*3/uL (ref 150–400)
RBC: 3.92 MIL/uL (ref 3.87–5.11)
RDW: 13.5 % (ref 11.5–15.5)
WBC: 15.4 10*3/uL — ABNORMAL HIGH (ref 4.0–10.5)
nRBC: 0 % (ref 0.0–0.2)

## 2022-02-02 LAB — BASIC METABOLIC PANEL
Anion gap: 8 (ref 5–15)
BUN: 17 mg/dL (ref 8–23)
CO2: 24 mmol/L (ref 22–32)
Calcium: 8.5 mg/dL — ABNORMAL LOW (ref 8.9–10.3)
Chloride: 107 mmol/L (ref 98–111)
Creatinine, Ser: 0.84 mg/dL (ref 0.44–1.00)
GFR, Estimated: >60 mL/min (ref >60)
Glucose, Bld: 146 mg/dL — ABNORMAL HIGH (ref 70–99)
Potassium: 4 mmol/L (ref 3.5–5.1)
Sodium: 139 mmol/L (ref 135–145)

## 2022-02-02 MED ORDER — DOCUSATE SODIUM 100 MG PO CAPS: 100.0000 mg | ORAL_CAPSULE | Freq: Two times a day (BID) | ORAL | 0 refills | Status: DC

## 2022-02-02 MED ORDER — CYCLOBENZAPRINE HCL 5 MG PO TABS: 5.0000 mg | ORAL_TABLET | Freq: Three times a day (TID) | ORAL | 0 refills | Status: AC | PRN

## 2022-02-02 MED ORDER — OXYCODONE-ACETAMINOPHEN 10-325 MG PO TABS
1.0000 | ORAL_TABLET | ORAL | 0 refills | Status: AC | PRN
Start: 2022-02-02 — End: 2022-02-07

## 2022-02-02 MED FILL — Sodium Chloride IV Soln 0.9%: INTRAVENOUS | Qty: 1000 | Status: AC

## 2022-02-02 MED FILL — Heparin Sodium (Porcine) Inj 1000 Unit/ML: INTRAMUSCULAR | Qty: 30 | Status: AC

## 2022-02-02 NOTE — Evaluation (Signed)
Occupational Therapy Evaluation and Discharge ?Patient Details ?Name: Tami Dickson ?MRN: [id number] ?DOB: 25-Dec-1950 ?Today's Date: 02/02/2022 ? ? ?History of Present Illness 71 y/o female admitted on 02/01/22 following PLIF L3-4. PMH: COPD, PVD, glaucoma (Simultaneous filing. User may not have seen previous data.)  ? ?Clinical Impression ?  ?This 71 yo female admitted and underwent above presents to acute OT with all education completed and post op back hand out given and reviewed. She has family with her 24/7 if she should need any A (more so with IADLs). No further OT needs, we will sign off.  ?   ? ?Recommendations for follow up therapy are one component of a multi-disciplinary discharge planning process, led by the attending physician.  Recommendations may be updated based on patient status, additional functional criteria and insurance authorization.  ? ?Follow Up Recommendations ? No OT follow up  ?  ?Assistance Recommended at Discharge Frequent or constant Supervision/Assistance  ?Patient can return home with the following A little help with bathing/dressing/bathroom;Assistance with cooking/housework;Assist for transportation;Direct supervision/assist for financial management;Direct supervision/assist for medications management ? ?  ?Functional Status Assessment ? Patient has had a recent decline in their functional status and demonstrates the ability to make significant improvements in function in a reasonable and predictable amount of time. (no further skilled OT needs)  ?Equipment Recommendations ? None recommended by OT  ?  ?   ?Precautions / Restrictions Precautions ?Precautions: Back (Simultaneous filing. User may not have seen previous data.) ?Precaution Booklet Issued: Yes (comment) ?Required Braces or Orthoses: Spinal Brace ?Spinal Brace: Applied in sitting position;Applied in standing position;Lumbar corset ?Restrictions ?Weight Bearing Restrictions: No  ? ?  ? ?Mobility Bed Mobility ?Overal bed  mobility: Needs Assistance ?Bed Mobility: Rolling, Sidelying to Sit ?Rolling: Supervision ?Sidelying to sit: Supervision ?  ?  ?  ?General bed mobility comments: VCs for technique (even though she reported the "girl that was in here eariler" (PT) showed me how to do it) ?  ? ?Transfers ?Overall transfer level: Needs assistance ?Equipment used: Rollator (4 wheels), None ?Transfers: Sit to/from Stand ?Sit to Stand: Supervision ?  ?  ?  ?  ?  ?  ?  ? ?  ?Balance Overall balance assessment: Mild deficits observed, not formally tested ?  ?  ?  ?  ?  ?  ?  ?  ?  ?  ?  ?  ?  ?  ?  ?  ?  ?  ?   ? ?ADL either performed or assessed with clinical judgement  ? ?ADL Overall ADL's : Needs assistance/impaired ?Eating/Feeding: Independent;Sitting ?  ?Grooming: Set up;Supervision/safety;Sitting;Brushing hair ?Grooming Details (indicate cue type and reason): Educated on use of 2 cups when brushing teeth to avoid bending over sink (one rinse and one spit) ?Upper Body Bathing: Set up;Supervision/ safety;Sitting ?  ?Lower Body Bathing: Set up;Supervison/ safety ?Lower Body Bathing Details (indicate cue type and reason): min A to wash lower legs and feet ?Upper Body Dressing : Set up;Supervision/safety;Sitting ?  ?Lower Body Dressing: Set up;Supervision/safety;Sit to/from stand;Adhering to back precautions;With adaptive equipment ?  ?Toilet Transfer: Supervision/safety;Ambulation ?  ?  ?Toileting - Clothing Manipulation Details (indicate cue type and reason): Educated on use of wet wipes for back peri care ?  ?  ?  ?   ? ? ? ?Vision Baseline Vision/History: 1 Wears glasses;3 Glaucoma ?Ability to See in Adequate Light: 0 Adequate ?Patient Visual Report: No change from baseline ?   ?   ?   ?   ? ?  Pertinent Vitals/Pain Pain Assessment ?Pain Assessment: 0-10 ?Pain Score: 3  ?Pain Location: incisional ("mainly when getting in and OOB") ?Pain Descriptors / Indicators: Aching, Sore ?Pain Intervention(s): Limited activity within patient's  tolerance, Repositioned, Monitored during session  ? ? ? ?Hand Dominance Right ?  ?Extremity/Trunk Assessment Upper Extremity Assessment ?Upper Extremity Assessment: Overall WFL for tasks assessed ?  ?  ?  ?  ?  ?Communication Communication ?Communication: No difficulties ?  ?Cognition Arousal/Alertness: Awake/alert ?Behavior During Therapy: [facility] for tasks assessed/performed ?  ?  ?  ?  ?  ?  ?  ?  ?  ?  ?  ?  ?  ?  ?  ?  ?  ?General Comments: Pt got her clothes out from her overnight bag, but then when she started to get dressed she was looking all around her to try and find them (when they were sitting right next to her on the bed)--several times I had to ask her what she was looking for and then remind her. ?  ?  ?   ?   ?   ? ? ?Home Living Family/patient expects to be discharged to:: Private residence ?Living Arrangements: Children;Other relatives ?Available Help at Discharge: Family;Available 24 hours/day ?Type of Home: House ?Home Access: Stairs to enter ?Entrance Stairs-Number of Steps: 1 ?Entrance Stairs-Rails: None ?Home Layout: One level ?  ?  ?Bathroom Shower/Tub: Walk-in shower ?  ?Bathroom Toilet: Standard ?  ?  ?Home Equipment: Cane - single point;Hand held shower head ?  ?  ?  ? ?  ?Prior Functioning/Environment Prior Level of Function : Independent/Modified Independent ?  ?  ?  ?  ?  ?  ?  ?  ?  ? ?  ?  ?OT Problem List: Decreased range of motion;Decreased cognition;Pain ?  ?   ?   ?OT Goals(Current goals can be found in the care plan section) Acute Rehab OT Goals ?Patient Stated Goal: to go home today  ?   ? ?   ?AM-PAC OT "6 Clicks" Daily Activity     ?Outcome Measure Help from another person eating meals?: None ?Help from another person taking care of personal grooming?: A Little ?Help from another person toileting, which includes using toliet, bedpan, or urinal?: A Little ?Help from another person bathing (including washing, rinsing, drying)?: A Little ?Help from another person to put on and  taking off regular upper body clothing?: A Little ?Help from another person to put on and taking off regular lower body clothing?: A Little ?6 Click Score: 19 ?  ?End of Session Equipment Utilized During Treatment: Back brace (had RN get her a new one (the one she had is 70 years old)) ?Nurse Communication:  (no further OT needs) ? ?Activity Tolerance: Patient tolerated treatment well ?Patient left: in chair;with call bell/phone within reach ? ?OT Visit Diagnosis: Other abnormalities of gait and mobility (R26.89);Muscle weakness (generalized) (M62.81);Pain ?Pain - part of body:  (incisional)  ?              ?Time: 4132-4401 ?OT Time Calculation (min): 41 min ?Charges:  OT General Charges ?$OT Visit: 1 Visit ?OT Treatments ?$Self Care/Home Management : 38-52 mins ? ?Golden Circle, OTR/L ?Acute Rehab Services ?Pager [phone] ?Office [phone] ? ? ? ?Almon Register ?02/02/2022, 9:52 AM ?

## 2022-02-02 NOTE — Progress Notes (Signed)
Patient alert and oriented, mae's well, voiding adequate amount of urine, swallowing without difficulty, no c/o pain at time of discharge. Patient discharged home with family. Script and discharged instructions given to patient. Patient and family stated understanding of instructions given. Patient has an appointment with Dr. Jenkins   

## 2022-02-02 NOTE — Evaluation (Signed)
Physical Therapy Evaluation & Discharge ?Patient Details ?Name: Tami Dickson ?MRN: [id number] ?DOB: 10-May-1951 ?Today's Date: 02/02/2022 ? ?History of Present Illness ? 71 y/o female admitted on 02/01/22 following PLIF L3-4. PMH: COPD, PVD, glaucoma  ?Clinical Impression ? Patient admitted following above procedure. Patient functioning at supervision level for mobility with use of RW. Educated patient on back precautions, brace wear, and progressive walking program, patient demonstrated understanding. Patient will have necessary supervision at home at discharge. No further skilled PT needs identified acutely. No PT follow up recommended at this time.    ?   ? ?Recommendations for follow up therapy are one component of a multi-disciplinary discharge planning process, led by the attending physician.  Recommendations may be updated based on patient status, additional functional criteria and insurance authorization. ? ?Follow Up Recommendations No PT follow up ? ?  ?Assistance Recommended at Discharge Intermittent Supervision/Assistance  ?Patient can return home with the following ?   ? ?  ?Equipment Recommendations Rolling Laakea Pereira (2 wheels)  ?Recommendations for Other Services ?    ?  ?Functional Status Assessment Patient has had a recent decline in their functional status and demonstrates the ability to make significant improvements in function in a reasonable and predictable amount of time.  ? ?  ?Precautions / Restrictions Precautions ?Precautions: Back ?Precaution Booklet Issued: Yes (comment) ?Required Braces or Orthoses: Spinal Brace ?Spinal Brace: Lumbar corset;Applied in sitting position ?Restrictions ?Weight Bearing Restrictions: No  ? ?  ? ?Mobility ? Bed Mobility ?Overal bed mobility: Needs Assistance ?Bed Mobility: Rolling, Sidelying to Sit ?Rolling: Supervision ?Sidelying to sit: Supervision ?  ?  ?  ?  ?  ? ?Transfers ?Overall transfer level: Needs assistance ?Equipment used: Rolling Brendolyn Stockley (2  wheels) ?Transfers: Sit to/from Stand ?Sit to Stand: Supervision ?  ?  ?  ?  ?  ?  ?  ? ?Ambulation/Gait ?Ambulation/Gait assistance: Supervision ?Gait Distance (Feet): 300 Feet ?Assistive device: Rolling Greogory Cornette (2 wheels) ?Gait Pattern/deviations: Step-through pattern, Decreased stride length ?Gait velocity: decreased ?  ?  ?General Gait Details: supervision for safety ? ?Stairs ?  ?  ?  ?  ?  ? ?Wheelchair Mobility ?  ? ?Modified Rankin (Stroke Patients Only) ?  ? ?  ? ?Balance Overall balance assessment: Mild deficits observed, not formally tested ?  ?  ?  ?  ?  ?  ?  ?  ?  ?  ?  ?  ?  ?  ?  ?  ?  ?  ?   ? ? ? ?Pertinent Vitals/Pain Pain Assessment ?Pain Assessment: Faces ?Faces Pain Scale: Hurts even more ?Pain Location: incisional ("mainly when getting in and OOB") ?Pain Descriptors / Indicators: Aching, Sore ?Pain Intervention(s): Monitored during session, Repositioned  ? ? ?Home Living Family/patient expects to be discharged to:: Private residence ?Living Arrangements: Children;Other relatives ?Available Help at Discharge: Family;Available 24 hours/day ?Type of Home: House ?Home Access: Stairs to enter ?Entrance Stairs-Rails: None ?Entrance Stairs-Number of Steps: 1 ?  ?Home Layout: One level ?Home Equipment: Cane - single point;Hand held shower head ?   ?  ?Prior Function Prior Level of Function : Independent/Modified Independent ?  ?  ?  ?  ?  ?  ?  ?  ?  ? ? ?Hand Dominance  ? Dominant Hand: Right ? ?  ?Extremity/Trunk Assessment  ? Upper Extremity Assessment ?Upper Extremity Assessment: Defer to OT evaluation ?  ? ?Lower Extremity Assessment ?Lower Extremity Assessment: Generalized weakness ?  ? ?Cervical /  Trunk Assessment ?Cervical / Trunk Assessment: Back Surgery  ?Communication  ? Communication: No difficulties  ?Cognition Arousal/Alertness: Awake/alert ?Behavior During Therapy: [facility] for tasks assessed/performed ?Overall Cognitive Status: No family/caregiver present to determine baseline cognitive  functioning ?  ?  ?  ?  ?  ?  ?  ?  ?  ?  ?  ?  ?  ?  ?  ?  ?General Comments: tangential and STM deficits noted during session ?  ?  ? ?  ?General Comments   ? ?  ?Exercises    ? ?Assessment/Plan  ?  ?PT Assessment Patient does not need any further PT services  ?PT Problem List   ? ?   ?  ?PT Treatment Interventions     ? ?PT Goals (Current goals can be found in the Care Plan section)  ?Acute Rehab PT Goals ?Patient Stated Goal: to go home ?PT Goal Formulation: All assessment and education complete, DC therapy ? ?  ?Frequency   ?  ? ? ?Co-evaluation   ?  ?  ?  ?  ? ? ?  ?AM-PAC PT "6 Clicks" Mobility  ?Outcome Measure Help needed turning from your back to your side while in a flat bed without using bedrails?: A Little ?Help needed moving from lying on your back to sitting on the side of a flat bed without using bedrails?: A Little ?Help needed moving to and from a bed to a chair (including a wheelchair)?: A Little ?Help needed standing up from a chair using your arms (e.g., wheelchair or bedside chair)?: A Little ?Help needed to walk in hospital room?: A Little ?Help needed climbing 3-5 steps with a railing? : A Little ?6 Click Score: 18 ? ?  ?End of Session Equipment Utilized During Treatment: Back brace ?Activity Tolerance: Patient tolerated treatment well ?Patient left: in bed;with call bell/phone within reach ?Nurse Communication: Mobility status ?PT Visit Diagnosis: Unsteadiness on feet (R26.81);Muscle weakness (generalized) (M62.81) ?  ? ?Time: 4888-9169 ?PT Time Calculation (min) (ACUTE ONLY): 23 min ? ? ?Charges:   PT Evaluation ?$PT Eval Low Complexity: 1 Low ?PT Treatments ?$Gait Training: 8-22 mins ?  ?   ? ? ?Jarman Litton A. Gilford Rile, PT, DPT ?Acute Rehabilitation Services ?Pager [phone] ?Office [phone] ? ? ?Tarren Velardi A Teon Hudnall ?02/02/2022, 10:08 AM ? ?

## 2022-02-02 NOTE — Anesthesia Postprocedure Evaluation (Signed)
Anesthesia Post Note ? ?Patient: Tami Dickson ? ?Procedure(s) Performed: PLIF L34, IP,POSTERIOR INSTRUMENTATION (Spine Lumbar) ? ?  ? ?Patient location during evaluation: PACU ?Anesthesia Type: General ?Level of consciousness: awake and alert ?Pain management: pain level controlled ?Vital Signs Assessment: post-procedure vital signs reviewed and stable ?Respiratory status: spontaneous breathing, nonlabored ventilation, respiratory function stable and patient connected to nasal cannula oxygen ?Cardiovascular status: blood pressure returned to baseline and stable ?Postop Assessment: no apparent nausea or vomiting ?Anesthetic complications: no ? ? ?No notable events documented. ? ?Last Vitals:  ?Vitals:  ? 02/02/22 0328 02/02/22 0744  ?BP: (!) 124/59 120/64  ?Pulse: 76 76  ?Resp: 18 18  ?Temp: (!) 36.4 ?C 36.5 ?C  ?SpO2: 96% 99%  ?  ?Last Pain:  ?Vitals:  ? 02/02/22 0744  ?TempSrc: Oral  ?PainSc:   ? ? ?  ?  ?  ?  ?  ?  ? ?[place] ? ? ? ? ?

## 2022-02-02 NOTE — Discharge Instructions (Addendum)
Wound Care ?Keep incision covered and dry for two days.    ?Do not put any creams, lotions, or ointments on incision. ?Leave steri-strips on back.  They will fall off by themselves. ?You are fine to shower. Let water run over incision and pat dry. ? ?Activity ?Walk each and every day, increasing distance each day. ?No lifting greater than 5 lbs.  Avoid excessive back motion. ?No driving for 2 weeks; may ride as a passenger locally. ? ?Diet ?Resume your normal diet. ?  ? ? ?Call Your Doctor If Any of These Occur ?Redness, drainage, or swelling at the wound.  ?Temperature greater than 101 degrees. ?Severe pain not relieved by pain medication. ?Incision starts to come apart. ? ?Follow Up Appt ?Call today for appointment in 1-2 weeks [phone]) or for problems.  If you have any hardware placed in your spine, you will need an x-ray before your appointment. ? ?

## 2022-02-02 NOTE — Discharge Summary (Signed)
Physician Discharge Summary  ? ? ? ?Providing Compassionate, Quality Care - Together ? ? ?Patient ID: ?Tami Dickson ?MRN: [id number] ?DOB/AGE: [birth year] 71 y.o. ? ?Admit date: 02/01/2022 ?Discharge date: 02/02/2022 ? ?Admission Diagnoses: Spondylolisthesis of lumbar spine ? ?Discharge Diagnoses:  ?Principal Problem: ?  Spondylolisthesis of lumbar region ? ? ?Discharged Condition: good ? ?Hospital Course: Patient underwent an by Dr. L3-4 PLIF on 02/01/2022 by Dr. Arnoldo Morale. She was admitted to 3C04 following recovery from anesthesia in the PACU. Her postoperative course has been uncomplicated. She has worked with both physical and occupational therapies who feel the patient is ready for discharge home. She is ambulating independently and without difficulty. She is tolerating a normal diet. She is not having any bowel or bladder dysfunction. Her pain is well-controlled with oral pain medication. She is ready for discharge home. ? ? ?Consults: PT/OT ? ?Significant Diagnostic Studies: radiology: DG Lumbar Spine 2-3 Views ? ?Result Date: 02/01/2022 ?CLINICAL DATA:  Lumbar spine surgery. EXAM: LUMBAR SPINE - 2-3 VIEW COMPARISON:  Lumbar spine x-rays from same day. FLUOROSCOPY TIME:  Radiation Exposure Index (as provided by the fluoroscopic device): 13.03 mGy Kerma C-arm fluoroscopic images were obtained intraoperatively and submitted for post operative interpretation. FINDINGS: AP and lateral intraoperative fluoroscopic images demonstrate interval L3-L4 PLIF. No evident hardware complication. Unchanged L4-L5 disc space narrowing. No acute osseous abnormality. IMPRESSION: 1. Intraoperative fluoroscopic guidance for L3-L4 PLIF. Electronically Signed   By: Titus Dubin M.D.   On: 02/01/2022 10:18  ? ?DG Lumbar Spine 1 View ? ?Result Date: 02/01/2022 ?CLINICAL DATA:  L3-L4 PLIF, localization EXAM: LUMBAR SPINE - 1 VIEW COMPARISON:  Portable cross-table lateral view 0816 hours compared to 08/29/2021 FINDINGS: Prior exam  demonstrates 5 lumbar vertebra. Metallic probe via dorsal approach projects dorsal to the upper L4 level. Additional surgical instruments and sponge project over the lower lumbar region and sacrum. Multilevel disc space narrowing. Minimal anterolisthesis L3-L4 unchanged. IMPRESSION: Posterior localization of the superior L4 level. Electronically Signed   By: Lavonia Dana M.D.   On: 02/01/2022 08:39  ? ?DG C-Arm 1-60 Min-No Report ? ?Result Date: 02/01/2022 ?Fluoroscopy was utilized by the requesting physician.  No radiographic interpretation.  ? ?DG C-Arm 1-60 Min-No Report ? ?Result Date: 02/01/2022 ?Fluoroscopy was utilized by the requesting physician.  No radiographic interpretation.   ? ? ?Treatments: surgery: Bilateral L3-4 laminotomy/foraminotomies/medial facetectomy to decompress the bilateral L3 and L4 nerve roots(the work required to do this was in addition to the work required to do the posterior lumbar interbody fusion because of the patient's spinal stenosis, facet arthropathy. Etc. requiring a wide decompression of the nerve roots.);  Right L3-4 transforaminal lumbar interbody fusion with local morselized autograft bone and Zimmer DBM; insertion of interbody prosthesis at L3-4 (globus peek expandable interbody prosthesis); posterior nonsegmental instrumentation from L3 to L4 with globus titanium pedicle screws and rods; posterior lateral arthrodesis at L3-4 with local morselized autograft bone and Zimmer DBM. ? ?Discharge Exam: ?Blood pressure 120/64, pulse 76, temperature 97.7 ?F (36.5 ?C), temperature source Oral, resp. rate 18, height 5\' 3"  (1.6 m), weight 93.9 kg, SpO2 99 %. ? ?Per Dr. Cam Hai assessment: ?Alert and oriented x 4 ?PERRLA ?CN II-XII grossly intact ?MAE, Strength and sensation intact ?Incision is covered with Honeycomb dressing and Steri Strips; Dressing is clean, dry, and intact ? ? ?Disposition: Discharge disposition: 01-Home or Self Care ? ? ? ? ? ? ? ?Allergies as of 02/02/2022   ? ?    Reactions  ? Atorvastatin  Other (See Comments)  ? Joint pain  ? Codeine Nausea And Vomiting  ? Other   ? Pt states it was something that was put over her mouth; believes it was ether  ? Statins Other (See Comments)  ? Joints hurt  ? ?  ? ?  ?Medication List  ?  ? ?STOP taking these medications   ? ?azelastine 0.1 % nasal spray ?Commonly known as: ASTELIN ?  ?celecoxib 200 MG capsule ?Commonly known as: CELEBREX ?  ?omeprazole 20 MG capsule ?Commonly known as: PRILOSEC ?  ?traMADol 50 MG tablet ?Commonly known as: Ultram ?  ?triamcinolone 55 MCG/ACT Aero nasal inhaler ?Commonly known as: NASACORT ?  ? ?  ? ?TAKE these medications   ? ?albuterol 108 (90 Base) MCG/ACT inhaler ?Commonly known as: VENTOLIN HFA ?Inhale 2 puffs into the lungs every 6 (six) hours as needed for wheezing or shortness of breath. ?  ?albuterol 0.63 MG/3ML nebulizer solution ?Commonly known as: ACCUNEB ?Take 1 ampule by nebulization every 6 (six) hours as needed for shortness of breath or wheezing. ?  ?cyclobenzaprine 5 MG tablet ?Commonly known as: FLEXERIL ?Take 1 tablet (5 mg total) by mouth 3 (three) times daily as needed for muscle spasms. ?  ?docusate sodium 100 MG capsule ?Commonly known as: COLACE ?Take 1 capsule (100 mg total) by mouth 2 (two) times daily. ?  ?dorzolamide-timolol 22.3-6.8 MG/ML ophthalmic solution ?Commonly known as: COSOPT ?Place 1 drop into both eyes 2 (two) times daily. ?  ?erythromycin ophthalmic ointment ?Place 1 application into both eyes at bedtime. ?  ?esomeprazole 40 MG capsule ?Commonly known as: [place] ?Take 40 mg by mouth daily. ?  ?famotidine 40 MG tablet ?Commonly known as: Pepcid ?Take 1 tablet (40 mg total) by mouth 2 (two) times a day. ?What changed:  ?when to take this ?reasons to take this ?  ?fexofenadine 180 MG tablet ?Commonly known as: ALLEGRA ?Take 1 tablet (180 mg total) by mouth daily. ?What changed:  ?when to take this ?reasons to take this ?  ?oxyCODONE-acetaminophen 10-325 MG  tablet ?Commonly known as: Percocet ?Take 1 tablet by mouth every 4 (four) hours as needed for up to 5 days for pain. ?  ?rosuvastatin 5 MG tablet ?Commonly known as: CRESTOR ?Take 5 mg by mouth at bedtime. ?  ?Symbicort 160-4.5 MCG/ACT inhaler ?Generic drug: budesonide-formoterol ?Inhale 2 puffs into the lungs 2 (two) times daily. ?  ?Travoprost (BAK Free) 0.004 % Soln ophthalmic solution ?Commonly known as: TRAVATAN ?Place 1 drop into both eyes at bedtime. ?  ?valACYclovir 1000 MG tablet ?Commonly known as: VALTREX ?Take 2,000 mg by mouth 2 (two) times daily as needed (fever blisters). ?  ? ?  ? ? Follow-up Information   ? ? Newman Pies, MD. Daphane Shepherd on 03/02/2022.   ?Specialty: Neurosurgery ?Why: First post op appoint with x-rays is at 8:15 am on 03/02/2022. ?Contact information: ?[address] ?[address] ?[place] [address] ?[phone] ? ? ?  ?  ? ?  ?  ? ?  ? ? ?Signed: ?Viona Gilmore, DNP, AGNP-C ?Nurse Practitioner ? ?[facility] ?[address], [address], [place], [place] [address] ?P: [phone]    F: [phone] ? ?02/02/2022, 10:39 AM ? ?

## 2022-02-02 NOTE — Progress Notes (Signed)
Subjective: ?The patient is alert and pleasant.  She looks well. ? ?Objective: ?Vital signs in last 24 hours: ?Temp:  [97.5 ?F (36.4 ?C)-98.3 ?F (36.8 ?C)] 97.7 ?F (36.5 ?C) (03/03 0744) ?Pulse Rate:  [55-82] 76 (03/03 0744) ?Resp:  [7-20] 18 (03/03 0744) ?BP: (111-195)/(57-95) 120/64 (03/03 0744) ?SpO2:  [89 %-100 %] 99 % (03/03 0744) ?Estimated body mass index is 36.67 kg/m? as calculated from the following: ?  Height as of this encounter: 5\' 3"  (1.6 m). ?  Weight as of this encounter: 93.9 kg. ? ? ?Intake/Output from previous day: ?03/02 0701 - 03/03 0700 ?In: 3716 [P.O.:600; I.V.:1750] ?Out: 780 [Urine:580; Blood:200] ?Intake/Output this shift: ?No intake/output data recorded. ? ?Physical exam the patient is alert and pleasant.  Her strength is normal. ? ?Lab Results: ?Recent Labs  ?  02/02/22 ?0609  ?WBC 15.4*  ?HGB 11.7*  ?HCT 35.6*  ?PLT 206  ? ?BMET ?Recent Labs  ?  02/02/22 ?0609  ?NA 139  ?K 4.0  ?CL 107  ?CO2 24  ?GLUCOSE 146*  ?BUN 17  ?CREATININE 0.84  ?CALCIUM 8.5*  ? ? ?Studies/Results: ?DG Lumbar Spine 2-3 Views ? ?Result Date: 02/01/2022 ?CLINICAL DATA:  Lumbar spine surgery. EXAM: LUMBAR SPINE - 2-3 VIEW COMPARISON:  Lumbar spine x-rays from same day. FLUOROSCOPY TIME:  Radiation Exposure Index (as provided by the fluoroscopic device): 13.03 mGy Kerma C-arm fluoroscopic images were obtained intraoperatively and submitted for post operative interpretation. FINDINGS: AP and lateral intraoperative fluoroscopic images demonstrate interval L3-L4 PLIF. No evident hardware complication. Unchanged L4-L5 disc space narrowing. No acute osseous abnormality. IMPRESSION: 1. Intraoperative fluoroscopic guidance for L3-L4 PLIF. Electronically Signed   By: Titus Dubin M.D.   On: 02/01/2022 10:18  ? ?DG Lumbar Spine 1 View ? ?Result Date: 02/01/2022 ?CLINICAL DATA:  L3-L4 PLIF, localization EXAM: LUMBAR SPINE - 1 VIEW COMPARISON:  Portable cross-table lateral view 0816 hours compared to 08/29/2021 FINDINGS: Prior  exam demonstrates 5 lumbar vertebra. Metallic probe via dorsal approach projects dorsal to the upper L4 level. Additional surgical instruments and sponge project over the lower lumbar region and sacrum. Multilevel disc space narrowing. Minimal anterolisthesis L3-L4 unchanged. IMPRESSION: Posterior localization of the superior L4 level. Electronically Signed   By: Lavonia Dana M.D.   On: 02/01/2022 08:39  ? ?DG C-Arm 1-60 Min-No Report ? ?Result Date: 02/01/2022 ?Fluoroscopy was utilized by the requesting physician.  No radiographic interpretation.  ? ?DG C-Arm 1-60 Min-No Report ? ?Result Date: 02/01/2022 ?Fluoroscopy was utilized by the requesting physician.  No radiographic interpretation.   ? ?Assessment/Plan: ?Postop day #1: The patient is doing well.  She will likely go home after she works with PT and OT.  I gave her her discharge instructions and answered all her questions. ? LOS: 0 days  ? ? ? ?Tami Dickson ?02/02/2022, 7:53 AM ? ? ? ? ?Patient ID: Tami Dickson, adult   DOB: 05-20-1951, 71 y.o.   MRN: [id number] ? ?

## 2022-02-02 NOTE — Progress Notes (Signed)
Orthopedic Tech Progress Note ?Patient Details:  ?Tami Dickson ?02-14-1951 ?[id number] ? ?Ortho Devices ?Type of Ortho Device: Lumbar corsett ?Ortho Device/Splint Interventions: Ordered ?  ?  ? ?Rigoberto Repass A Goodwin [place] ?02/02/2022, 9:24 AM ? ?

## 2022-02-02 NOTE — Plan of Care (Signed)
WNL

## 2022-02-15 ENCOUNTER — Encounter (HOSPITAL_COMMUNITY): Payer: Self-pay | Admitting: Neurosurgery

## 2022-02-21 DIAGNOSIS — H0288A Meibomian gland dysfunction right eye, upper and lower eyelids: Secondary | ICD-10-CM | POA: Diagnosis not present

## 2022-02-21 DIAGNOSIS — G510 Bell's palsy: Secondary | ICD-10-CM | POA: Diagnosis not present

## 2022-02-21 DIAGNOSIS — Z79899 Other long term (current) drug therapy: Secondary | ICD-10-CM | POA: Diagnosis not present

## 2022-02-21 DIAGNOSIS — H527 Unspecified disorder of refraction: Secondary | ICD-10-CM | POA: Diagnosis not present

## 2022-02-21 DIAGNOSIS — J45909 Unspecified asthma, uncomplicated: Secondary | ICD-10-CM | POA: Diagnosis not present

## 2022-02-21 DIAGNOSIS — D23121 Other benign neoplasm of skin of left upper eyelid, including canthus: Secondary | ICD-10-CM | POA: Diagnosis not present

## 2022-02-21 DIAGNOSIS — H402223 Chronic angle-closure glaucoma, left eye, severe stage: Secondary | ICD-10-CM | POA: Diagnosis not present

## 2022-02-21 DIAGNOSIS — H52209 Unspecified astigmatism, unspecified eye: Secondary | ICD-10-CM | POA: Diagnosis not present

## 2022-02-21 DIAGNOSIS — Z961 Presence of intraocular lens: Secondary | ICD-10-CM | POA: Diagnosis not present

## 2022-02-21 DIAGNOSIS — H402212 Chronic angle-closure glaucoma, right eye, moderate stage: Secondary | ICD-10-CM | POA: Diagnosis not present

## 2022-02-21 DIAGNOSIS — H0288B Meibomian gland dysfunction left eye, upper and lower eyelids: Secondary | ICD-10-CM | POA: Diagnosis not present

## 2022-02-27 DIAGNOSIS — M4316 Spondylolisthesis, lumbar region: Secondary | ICD-10-CM | POA: Diagnosis not present

## 2022-03-30 DIAGNOSIS — Z1212 Encounter for screening for malignant neoplasm of rectum: Secondary | ICD-10-CM | POA: Diagnosis not present

## 2022-03-30 DIAGNOSIS — E538 Deficiency of other specified B group vitamins: Secondary | ICD-10-CM | POA: Diagnosis not present

## 2022-03-30 DIAGNOSIS — I10 Essential (primary) hypertension: Secondary | ICD-10-CM | POA: Diagnosis not present

## 2022-03-30 DIAGNOSIS — G319 Degenerative disease of nervous system, unspecified: Secondary | ICD-10-CM | POA: Diagnosis not present

## 2022-03-30 DIAGNOSIS — Z9189 Other specified personal risk factors, not elsewhere classified: Secondary | ICD-10-CM | POA: Diagnosis not present

## 2022-03-30 DIAGNOSIS — R413 Other amnesia: Secondary | ICD-10-CM | POA: Diagnosis not present

## 2022-03-30 DIAGNOSIS — Z6835 Body mass index (BMI) 35.0-35.9, adult: Secondary | ICD-10-CM | POA: Diagnosis not present

## 2022-03-30 DIAGNOSIS — J449 Chronic obstructive pulmonary disease, unspecified: Secondary | ICD-10-CM | POA: Diagnosis not present

## 2022-03-30 DIAGNOSIS — E611 Iron deficiency: Secondary | ICD-10-CM | POA: Diagnosis not present

## 2022-03-30 DIAGNOSIS — K2100 Gastro-esophageal reflux disease with esophagitis, without bleeding: Secondary | ICD-10-CM | POA: Diagnosis not present

## 2022-03-30 DIAGNOSIS — M545 Low back pain, unspecified: Secondary | ICD-10-CM | POA: Diagnosis not present

## 2022-04-04 DIAGNOSIS — R7402 Elevation of levels of lactic acid dehydrogenase (LDH): Secondary | ICD-10-CM | POA: Diagnosis not present

## 2022-04-04 DIAGNOSIS — R7401 Elevation of levels of liver transaminase levels: Secondary | ICD-10-CM | POA: Diagnosis not present

## 2022-04-10 DIAGNOSIS — K7689 Other specified diseases of liver: Secondary | ICD-10-CM | POA: Diagnosis not present

## 2022-04-10 DIAGNOSIS — R748 Abnormal levels of other serum enzymes: Secondary | ICD-10-CM | POA: Diagnosis not present

## 2022-04-23 DIAGNOSIS — K7689 Other specified diseases of liver: Secondary | ICD-10-CM | POA: Diagnosis not present

## 2022-04-23 DIAGNOSIS — J449 Chronic obstructive pulmonary disease, unspecified: Secondary | ICD-10-CM | POA: Diagnosis not present

## 2022-04-23 DIAGNOSIS — E6609 Other obesity due to excess calories: Secondary | ICD-10-CM | POA: Diagnosis not present

## 2022-04-23 DIAGNOSIS — Z1159 Encounter for screening for other viral diseases: Secondary | ICD-10-CM | POA: Diagnosis not present

## 2022-04-23 DIAGNOSIS — E782 Mixed hyperlipidemia: Secondary | ICD-10-CM | POA: Diagnosis not present

## 2022-04-23 DIAGNOSIS — R748 Abnormal levels of other serum enzymes: Secondary | ICD-10-CM | POA: Diagnosis not present

## 2022-04-23 DIAGNOSIS — I10 Essential (primary) hypertension: Secondary | ICD-10-CM | POA: Diagnosis not present

## 2022-04-23 DIAGNOSIS — Z1329 Encounter for screening for other suspected endocrine disorder: Secondary | ICD-10-CM | POA: Diagnosis not present

## 2022-04-23 DIAGNOSIS — K2100 Gastro-esophageal reflux disease with esophagitis, without bleeding: Secondary | ICD-10-CM | POA: Diagnosis not present

## 2022-04-23 DIAGNOSIS — R10816 Epigastric abdominal tenderness: Secondary | ICD-10-CM | POA: Diagnosis not present

## 2022-04-23 DIAGNOSIS — R5381 Other malaise: Secondary | ICD-10-CM | POA: Diagnosis not present

## 2022-04-26 DIAGNOSIS — M545 Low back pain, unspecified: Secondary | ICD-10-CM | POA: Diagnosis not present

## 2022-04-26 DIAGNOSIS — Z6834 Body mass index (BMI) 34.0-34.9, adult: Secondary | ICD-10-CM | POA: Diagnosis not present

## 2022-04-26 DIAGNOSIS — K2100 Gastro-esophageal reflux disease with esophagitis, without bleeding: Secondary | ICD-10-CM | POA: Diagnosis not present

## 2022-04-26 DIAGNOSIS — K760 Fatty (change of) liver, not elsewhere classified: Secondary | ICD-10-CM | POA: Diagnosis not present

## 2022-04-26 DIAGNOSIS — I10 Essential (primary) hypertension: Secondary | ICD-10-CM | POA: Diagnosis not present

## 2022-04-26 DIAGNOSIS — J449 Chronic obstructive pulmonary disease, unspecified: Secondary | ICD-10-CM | POA: Diagnosis not present

## 2022-05-21 DIAGNOSIS — L821 Other seborrheic keratosis: Secondary | ICD-10-CM | POA: Diagnosis not present

## 2022-06-01 DIAGNOSIS — Z6834 Body mass index (BMI) 34.0-34.9, adult: Secondary | ICD-10-CM | POA: Diagnosis not present

## 2022-06-01 DIAGNOSIS — M4316 Spondylolisthesis, lumbar region: Secondary | ICD-10-CM | POA: Diagnosis not present

## 2022-07-23 DIAGNOSIS — S0083XA Contusion of other part of head, initial encounter: Secondary | ICD-10-CM | POA: Diagnosis not present

## 2022-07-23 DIAGNOSIS — Z6833 Body mass index (BMI) 33.0-33.9, adult: Secondary | ICD-10-CM | POA: Diagnosis not present

## 2022-07-23 DIAGNOSIS — S161XXA Strain of muscle, fascia and tendon at neck level, initial encounter: Secondary | ICD-10-CM | POA: Diagnosis not present

## 2022-07-23 DIAGNOSIS — R030 Elevated blood-pressure reading, without diagnosis of hypertension: Secondary | ICD-10-CM | POA: Diagnosis not present

## 2022-08-02 DIAGNOSIS — H401130 Primary open-angle glaucoma, bilateral, stage unspecified: Secondary | ICD-10-CM | POA: Diagnosis not present

## 2022-08-02 DIAGNOSIS — J309 Allergic rhinitis, unspecified: Secondary | ICD-10-CM | POA: Diagnosis not present

## 2022-08-02 DIAGNOSIS — E669 Obesity, unspecified: Secondary | ICD-10-CM | POA: Diagnosis not present

## 2022-08-02 DIAGNOSIS — J449 Chronic obstructive pulmonary disease, unspecified: Secondary | ICD-10-CM | POA: Diagnosis not present

## 2022-08-02 DIAGNOSIS — G319 Degenerative disease of nervous system, unspecified: Secondary | ICD-10-CM | POA: Diagnosis not present

## 2022-08-02 DIAGNOSIS — G8929 Other chronic pain: Secondary | ICD-10-CM | POA: Diagnosis not present

## 2022-08-02 DIAGNOSIS — B009 Herpesviral infection, unspecified: Secondary | ICD-10-CM | POA: Diagnosis not present

## 2022-08-02 DIAGNOSIS — K219 Gastro-esophageal reflux disease without esophagitis: Secondary | ICD-10-CM | POA: Diagnosis not present

## 2022-08-02 DIAGNOSIS — I6782 Cerebral ischemia: Secondary | ICD-10-CM | POA: Diagnosis not present

## 2022-08-02 DIAGNOSIS — E785 Hyperlipidemia, unspecified: Secondary | ICD-10-CM | POA: Diagnosis not present

## 2022-08-02 DIAGNOSIS — E782 Mixed hyperlipidemia: Secondary | ICD-10-CM | POA: Diagnosis not present

## 2022-08-02 DIAGNOSIS — F0390 Unspecified dementia without behavioral disturbance: Secondary | ICD-10-CM | POA: Diagnosis not present

## 2022-08-02 DIAGNOSIS — I8393 Asymptomatic varicose veins of bilateral lower extremities: Secondary | ICD-10-CM | POA: Diagnosis not present

## 2022-08-02 DIAGNOSIS — J45909 Unspecified asthma, uncomplicated: Secondary | ICD-10-CM | POA: Diagnosis not present

## 2022-09-13 DIAGNOSIS — R748 Abnormal levels of other serum enzymes: Secondary | ICD-10-CM | POA: Diagnosis not present

## 2022-09-13 DIAGNOSIS — I10 Essential (primary) hypertension: Secondary | ICD-10-CM | POA: Diagnosis not present

## 2022-09-13 DIAGNOSIS — K2100 Gastro-esophageal reflux disease with esophagitis, without bleeding: Secondary | ICD-10-CM | POA: Diagnosis not present

## 2022-09-13 DIAGNOSIS — K760 Fatty (change of) liver, not elsewhere classified: Secondary | ICD-10-CM | POA: Diagnosis not present

## 2022-09-13 DIAGNOSIS — E7849 Other hyperlipidemia: Secondary | ICD-10-CM | POA: Diagnosis not present

## 2022-09-13 DIAGNOSIS — R5383 Other fatigue: Secondary | ICD-10-CM | POA: Diagnosis not present

## 2022-09-13 DIAGNOSIS — E039 Hypothyroidism, unspecified: Secondary | ICD-10-CM | POA: Diagnosis not present

## 2022-09-17 DIAGNOSIS — I10 Essential (primary) hypertension: Secondary | ICD-10-CM | POA: Diagnosis not present

## 2022-09-17 DIAGNOSIS — Z6831 Body mass index (BMI) 31.0-31.9, adult: Secondary | ICD-10-CM | POA: Diagnosis not present

## 2022-09-17 DIAGNOSIS — Z1331 Encounter for screening for depression: Secondary | ICD-10-CM | POA: Diagnosis not present

## 2022-09-17 DIAGNOSIS — K2100 Gastro-esophageal reflux disease with esophagitis, without bleeding: Secondary | ICD-10-CM | POA: Diagnosis not present

## 2022-09-17 DIAGNOSIS — J449 Chronic obstructive pulmonary disease, unspecified: Secondary | ICD-10-CM | POA: Diagnosis not present

## 2022-09-17 DIAGNOSIS — Z0001 Encounter for general adult medical examination with abnormal findings: Secondary | ICD-10-CM | POA: Diagnosis not present

## 2022-09-17 DIAGNOSIS — Z1212 Encounter for screening for malignant neoplasm of rectum: Secondary | ICD-10-CM | POA: Diagnosis not present

## 2022-09-17 DIAGNOSIS — K760 Fatty (change of) liver, not elsewhere classified: Secondary | ICD-10-CM | POA: Diagnosis not present

## 2022-09-17 DIAGNOSIS — Z23 Encounter for immunization: Secondary | ICD-10-CM | POA: Diagnosis not present

## 2022-09-17 DIAGNOSIS — Z1389 Encounter for screening for other disorder: Secondary | ICD-10-CM | POA: Diagnosis not present

## 2022-09-20 DIAGNOSIS — Z23 Encounter for immunization: Secondary | ICD-10-CM | POA: Diagnosis not present

## 2022-09-25 DIAGNOSIS — Z1231 Encounter for screening mammogram for malignant neoplasm of breast: Secondary | ICD-10-CM | POA: Diagnosis not present

## 2022-10-03 ENCOUNTER — Encounter: Payer: Self-pay | Admitting: Internal Medicine

## 2022-10-17 DIAGNOSIS — Z780 Asymptomatic menopausal state: Secondary | ICD-10-CM | POA: Diagnosis not present

## 2022-10-17 DIAGNOSIS — M810 Age-related osteoporosis without current pathological fracture: Secondary | ICD-10-CM | POA: Diagnosis not present

## 2022-10-17 DIAGNOSIS — Z13820 Encounter for screening for osteoporosis: Secondary | ICD-10-CM | POA: Diagnosis not present

## 2022-10-31 DIAGNOSIS — H0288A Meibomian gland dysfunction right eye, upper and lower eyelids: Secondary | ICD-10-CM | POA: Diagnosis not present

## 2022-10-31 DIAGNOSIS — Z961 Presence of intraocular lens: Secondary | ICD-10-CM | POA: Diagnosis not present

## 2022-10-31 DIAGNOSIS — G510 Bell's palsy: Secondary | ICD-10-CM | POA: Diagnosis not present

## 2022-10-31 DIAGNOSIS — H402233 Chronic angle-closure glaucoma, bilateral, severe stage: Secondary | ICD-10-CM | POA: Diagnosis not present

## 2022-10-31 DIAGNOSIS — H0288B Meibomian gland dysfunction left eye, upper and lower eyelids: Secondary | ICD-10-CM | POA: Diagnosis not present

## 2022-11-05 NOTE — Progress Notes (Unsigned)
GI Office Note    Referring Provider: Caryl Bis, MD Primary Care Physician:  Caryl Bis, MD  Primary Gastroenterologist: Cristopher Estimable.Rourk, MD   Chief Complaint   No chief complaint on file.   History of Present Illness   Tami Dickson is a 71 y.o. adult presenting today at the request of Caryl Bis, MD for ***blood in stools.  Last abdominal imaging in January 2022 with normal abdominal ultrasound.  DEXA scan completed 10/17/2022: ***  No prior colonoscopy or upper endoscopy on file.  Hemoglobin 11.7 in March 2023.   Labs 04/04/2022 performed by [facility]: Negative hepatitis B surface antibody, negative hepatitis B surface antigen, hepatitis C antibody, hepatitis A antibody, HIV, ASMA, AMA, alpha-1 antitrypsin, ceruloplasmin.  ANA positive with 1:160 ratio.   Today:    Current Outpatient Medications  Medication Sig Dispense Refill   albuterol (ACCUNEB) 0.63 MG/3ML nebulizer solution Take 1 ampule by nebulization every 6 (six) hours as needed for shortness of breath or wheezing.     albuterol (PROVENTIL HFA;VENTOLIN HFA) 108 (90 Base) MCG/ACT inhaler Inhale 2 puffs into the lungs every 6 (six) hours as needed for wheezing or shortness of breath. 1 Inhaler 1   cyclobenzaprine (FLEXERIL) 5 MG tablet Take 1 tablet (5 mg total) by mouth 3 (three) times daily as needed for muscle spasms. 30 tablet 0   docusate sodium (COLACE) 100 MG capsule Take 1 capsule (100 mg total) by mouth 2 (two) times daily. 10 capsule 0   dorzolamide-timolol (COSOPT) 22.3-6.8 MG/ML ophthalmic solution Place 1 drop into both eyes 2 (two) times daily.  11   erythromycin ophthalmic ointment Place 1 application into both eyes at bedtime.     esomeprazole (NEXIUM) 40 MG capsule Take 40 mg by mouth daily.     famotidine (PEPCID) 40 MG tablet Take 1 tablet (40 mg total) by mouth 2 (two) times a day. (Patient taking differently: Take 40 mg by mouth 2 (two) times daily as needed for heartburn or  indigestion.) 60 tablet 5   fexofenadine (ALLEGRA) 180 MG tablet Take 1 tablet (180 mg total) by mouth daily. (Patient taking differently: Take 180 mg by mouth daily as needed for allergies.) 30 tablet 5   rosuvastatin (CRESTOR) 5 MG tablet Take 5 mg by mouth at bedtime.     SYMBICORT 160-4.5 MCG/ACT inhaler Inhale 2 puffs into the lungs 2 (two) times daily. 1 Inhaler 5   Travoprost, BAK Free, (TRAVATAN) 0.004 % SOLN ophthalmic solution Place 1 drop into both eyes at bedtime.     valACYclovir (VALTREX) 1000 MG tablet Take 2,000 mg by mouth 2 (two) times daily as needed (fever blisters).     No current facility-administered medications for this visit.    Past Medical History:  Diagnosis Date   Complication of anesthesia    COPD (chronic obstructive pulmonary disease) (HCC)    Dyspnea    GERD (gastroesophageal reflux disease)    Glaucoma    Peripheral vascular disease (HCC)    PONV (postoperative nausea and vomiting)     Past Surgical History:  Procedure Laterality Date   ABDOMINAL HYSTERECTOMY     EYE SURGERY Bilateral    ROTATOR CUFF REPAIR     TONSILLECTOMY     WRIST ARTHROSCOPY Left 06/30/2020   Procedure: ARTHROSCOPY WRIST POSSIBLE DEBRIDEMENT; OPEN STABILIZATION EXSTENSOR CARPI ULNARIS; DEEPENING ULNAR GROOVE;  Surgeon: Daryll Brod, MD;  Location: [place];  Service: Orthopedics;  Laterality: Left;   WRIST ARTHROSCOPY  WITH FOVEAL TRIANGULAR FIBROCARTILAGE COMPLEX REPAIR Left 06/30/2020   Procedure: WRIST ARTHROSCOPY WITH LUNOTRIQUETRAL/TRIANGULAR FIBROCARTILAGE COMPLEX REPAIR;  Surgeon: Daryll Brod, MD;  Location: [place];  Service: Orthopedics;  Laterality: Left;  AXILLARY BLOCK    Family History  Problem Relation Age of Onset   Heart disease Mother    Varicose Veins Mother    Heart disease Father    Varicose Veins Sister    Hypertension Sister    Allergic rhinitis Neg Hx    Asthma Neg Hx     Allergies as of 11/06/2022 - Review  Complete 02/01/2022  Allergen Reaction Noted   Atorvastatin Other (See Comments) 07/28/2013   Codeine Nausea And Vomiting 01/16/2019   Other  01/24/2022   Statins Other (See Comments) 05/21/2017    Social History   Socioeconomic History   Marital status: Widowed    Spouse name: Not on file   Number of children: 4   Years of education: Not on file   Highest education level: Not on file  Occupational History   Not on file  Tobacco Use   Smoking status: Never   Smokeless tobacco: Never  Vaping Use   Vaping Use: Never used  Substance and Sexual Activity   Alcohol use: Not Currently   Drug use: Never   Sexual activity: Not Currently  Other Topics Concern   Not on file  Social History Narrative   Not on file   Social Determinants of Health   Financial Resource Strain: Not on file  Food Insecurity: Not on file  Transportation Needs: Not on file  Physical Activity: Not on file  Stress: Not on file  Social Connections: Not on file  Intimate Partner Violence: Not on file     Review of Systems   Gen: Denies any fever, chills, fatigue, weight loss, lack of appetite.  CV: Denies chest pain, heart palpitations, peripheral edema, syncope.  Resp: Denies shortness of breath at rest or with exertion. Denies wheezing or cough.  GI: see HPI GU : Denies urinary burning, urinary frequency, urinary hesitancy MS: Denies joint pain, muscle weakness, cramps, or limitation of movement.  Derm: Denies rash, itching, dry skin Psych: Denies depression, anxiety, memory loss, and confusion Heme: Denies bruising, bleeding, and enlarged lymph nodes.   Physical Exam   There were no vitals taken for this visit.  General:   Alert and oriented. Pleasant and cooperative. Well-nourished and well-developed.  Head:  Normocephalic and atraumatic. Eyes:  Without icterus, sclera clear and conjunctiva pink.  Ears:  Normal auditory acuity. Mouth:  No deformity or lesions, oral mucosa pink.  Lungs:   Clear to auscultation bilaterally. No wheezes, rales, or rhonchi. No distress.  Heart:  S1, S2 present without murmurs appreciated.  Abdomen:  +BS, soft, non-tender and non-distended. No HSM noted. No guarding or rebound. No masses appreciated.  Rectal:  Deferred  Msk:  Symmetrical without gross deformities. Normal posture. Extremities:  Without edema. Neurologic:  Alert and  oriented x4;  grossly normal neurologically. Skin:  Intact without significant lesions or rashes. Psych:  Alert and cooperative. Normal mood and affect.   Assessment   Tami Dickson is a 71 y.o. adult with a history of COPD, GERD, PVD, chronic angle-closure glaucoma*** presenting today for evaluation of blood in stools.  Blood in stools:    PLAN   *** TCS?   Venetia Night, MSN, FNP-BC, AGACNP-BC [facility]

## 2022-11-06 ENCOUNTER — Telehealth: Payer: Self-pay | Admitting: *Deleted

## 2022-11-06 ENCOUNTER — Encounter: Payer: Self-pay | Admitting: Gastroenterology

## 2022-11-06 ENCOUNTER — Ambulatory Visit (INDEPENDENT_AMBULATORY_CARE_PROVIDER_SITE_OTHER): Payer: PPO | Admitting: Gastroenterology

## 2022-11-06 VITALS — BP 144/68 | HR 55 | Temp 97.7°F | Ht 63.0 in | Wt 178.2 lb

## 2022-11-06 DIAGNOSIS — Z1211 Encounter for screening for malignant neoplasm of colon: Secondary | ICD-10-CM

## 2022-11-06 DIAGNOSIS — K921 Melena: Secondary | ICD-10-CM

## 2022-11-06 DIAGNOSIS — K219 Gastro-esophageal reflux disease without esophagitis: Secondary | ICD-10-CM

## 2022-11-06 NOTE — Telephone Encounter (Signed)
LMOVM to return call  TCS w/Dr.Rourk, ASA 2 Screening colon cancer Blood in stool GERD

## 2022-11-06 NOTE — Patient Instructions (Addendum)
We are scheduling you for a colonoscopy in the near future with Dr. Gala Romney.  You received separate written instructions in the mail regarding your prep.  Will be sent straight to the pharmacy for you.  Please fill out a records request upfront to request your prior colonoscopy and EGD records from [facility] and [facility].  Your blood pressure is slightly elevated today, please continue to monitor and follow-up with your PCP for blood pressure management.  I we have a wonderful Christmas and a happy new year!  It was a pleasure to see you today. I want to create trusting relationships with patients. If you receive a survey regarding your visit,  I greatly appreciate you taking time to fill this out on paper or through your MyChart. I value your feedback.  Venetia Night, MSN, FNP-BC, AGACNP-BC [facility]

## 2022-11-23 ENCOUNTER — Encounter: Payer: Self-pay | Admitting: *Deleted

## 2022-11-23 MED ORDER — PEG 3350-KCL-NA BICARB-NACL 420 G PO SOLR: 4000.0000 mL | Freq: Once | ORAL | 0 refills | Status: AC

## 2022-11-23 NOTE — Telephone Encounter (Signed)
Pt has been scheduled for 12/27/22 at 10:45 am. Instructions mailed and prep sent to the pharmacy

## 2022-12-04 DIAGNOSIS — M4316 Spondylolisthesis, lumbar region: Secondary | ICD-10-CM | POA: Diagnosis not present

## 2022-12-04 DIAGNOSIS — M48062 Spinal stenosis, lumbar region with neurogenic claudication: Secondary | ICD-10-CM | POA: Diagnosis not present

## 2022-12-14 ENCOUNTER — Telehealth: Payer: Self-pay | Admitting: Gastroenterology

## 2022-12-14 NOTE — Telephone Encounter (Signed)
Records received from [facility].  Colonoscopy 01/30/16 performed by Dr. Anthony Sar: - normal colonoscopy  -Repeat in 10 years.   No EGD or path reports available or found to be sent.   According to prior requests we will proceed with TCS for now given positive heme stool. May eventually need EGD if colon unrevealing or evidence of overt GI bleed.  Venetia Night, MSN, APRN, FNP-BC, AGACNP-BC [facility]

## 2022-12-27 ENCOUNTER — Encounter (HOSPITAL_COMMUNITY): Payer: Self-pay | Admitting: Internal Medicine

## 2022-12-27 ENCOUNTER — Ambulatory Visit (HOSPITAL_COMMUNITY)
Admission: RE | Admit: 2022-12-27 | Discharge: 2022-12-27 | Disposition: A | Discharge status: Home / Self Care | Payer: PPO | Attending: Internal Medicine | Admitting: Internal Medicine

## 2022-12-27 ENCOUNTER — Encounter (HOSPITAL_COMMUNITY)
Admission: RE | Disposition: A | Discharge status: Home / Self Care | Payer: Self-pay | Source: Home / Self Care | Attending: Internal Medicine

## 2022-12-27 ENCOUNTER — Other Ambulatory Visit: Payer: Self-pay

## 2022-12-27 ENCOUNTER — Ambulatory Visit (HOSPITAL_COMMUNITY): Payer: PPO | Admitting: Anesthesiology

## 2022-12-27 ENCOUNTER — Ambulatory Visit (HOSPITAL_BASED_OUTPATIENT_CLINIC_OR_DEPARTMENT_OTHER): Payer: PPO | Admitting: Anesthesiology

## 2022-12-27 DIAGNOSIS — J449 Chronic obstructive pulmonary disease, unspecified: Secondary | ICD-10-CM | POA: Diagnosis not present

## 2022-12-27 DIAGNOSIS — D120 Benign neoplasm of cecum: Secondary | ICD-10-CM

## 2022-12-27 DIAGNOSIS — K5730 Diverticulosis of large intestine without perforation or abscess without bleeding: Secondary | ICD-10-CM | POA: Diagnosis not present

## 2022-12-27 DIAGNOSIS — K921 Melena: Secondary | ICD-10-CM | POA: Diagnosis not present

## 2022-12-27 DIAGNOSIS — D124 Benign neoplasm of descending colon: Secondary | ICD-10-CM

## 2022-12-27 DIAGNOSIS — Z1211 Encounter for screening for malignant neoplasm of colon: Secondary | ICD-10-CM

## 2022-12-27 DIAGNOSIS — H4020X0 Unspecified primary angle-closure glaucoma, stage unspecified: Secondary | ICD-10-CM | POA: Diagnosis not present

## 2022-12-27 DIAGNOSIS — K640 First degree hemorrhoids: Secondary | ICD-10-CM | POA: Diagnosis not present

## 2022-12-27 DIAGNOSIS — K219 Gastro-esophageal reflux disease without esophagitis: Secondary | ICD-10-CM | POA: Insufficient documentation

## 2022-12-27 DIAGNOSIS — I739 Peripheral vascular disease, unspecified: Secondary | ICD-10-CM | POA: Diagnosis not present

## 2022-12-27 HISTORY — PX: POLYPECTOMY: SHX5525

## 2022-12-27 HISTORY — PX: COLONOSCOPY WITH PROPOFOL: SHX5780

## 2022-12-27 SURGERY — COLONOSCOPY WITH PROPOFOL
Anesthesia: General

## 2022-12-27 MED ORDER — LACTATED RINGERS IV SOLN: INTRAVENOUS | Status: DC

## 2022-12-27 MED ORDER — PROPOFOL 10 MG/ML IV BOLUS
INTRAVENOUS | Status: DC | PRN
  Administered 2022-12-27: 20 mg via INTRAVENOUS
  Administered 2022-12-27: 80 mg via INTRAVENOUS

## 2022-12-27 MED ORDER — LIDOCAINE HCL (CARDIAC) PF 100 MG/5ML IV SOSY
PREFILLED_SYRINGE | INTRAVENOUS | Status: DC | PRN
  Administered 2022-12-27: 50 mg via INTRATRACHEAL

## 2022-12-27 MED ORDER — PROPOFOL 500 MG/50ML IV EMUL
INTRAVENOUS | Status: DC | PRN
  Administered 2022-12-27: 200 ug/kg/min via INTRAVENOUS

## 2022-12-27 MED ORDER — STERILE WATER FOR IRRIGATION IR SOLN
Status: DC | PRN
  Administered 2022-12-27: .6 mL

## 2022-12-27 NOTE — Op Note (Signed)
[facility] Patient Name: Tami Dickson Procedure Date: 12/27/2022 10:47 AM MRN: [id number] Date of Birth: 1951-07-16 Attending MD: Norvel Richards , MD, [id number] CSN: [id number] Age: 72 Admit Type: Outpatient Procedure:                Colonoscopy Indications:              Screening for colorectal malignant neoplasm Providers:                Norvel Richards, MD, Janeece Riggers, RN, Dereck Leep, Technician Referring MD:              Medicines:                Propofol per Anesthesia Complications:            No immediate complications. Estimated Blood Loss:     Estimated blood loss was minimal. Procedure:                Pre-Anesthesia Assessment:                           - Prior to the procedure, a History and Physical                            was performed, and patient medications and                            allergies were reviewed. The patient's tolerance of                            previous anesthesia was also reviewed. The risks                            and benefits of the procedure and the sedation                            options and risks were discussed with the patient.                            All questions were answered, and informed consent                            was obtained. Prior Anticoagulants: The patient has                            taken no anticoagulant or antiplatelet agents. ASA                            Grade Assessment: II - A patient with mild systemic                            disease. After reviewing the risks and benefits,  the patient was deemed in satisfactory condition to                            undergo the procedure.                           After obtaining informed consent, the colonoscope                            was passed under direct vision. Throughout the                            procedure, the patient's blood pressure, pulse, and                             oxygen saturations were monitored continuously. The                            [id number]) scope was introduced through the                            anus and advanced to the the cecum, identified by                            appendiceal orifice and ileocecal valve. The                            colonoscopy was performed without difficulty. The                            patient tolerated the procedure well. The quality                            of the bowel preparation was adequate. The                            ileocecal valve, appendiceal orifice, and rectum                            were photographed. The entire colon was well                            visualized. Scope In: 11:09:19 AM Scope Out: 11:23:46 AM Scope Withdrawal Time: 0 hours 7 minutes 14 seconds  Total Procedure Duration: 0 hours 14 minutes 27 seconds  Findings:      The perianal and digital rectal examinations were normal.      Non-bleeding internal hemorrhoids were found during retroflexion. The       hemorrhoids were moderate, medium-sized and Grade I (internal       hemorrhoids that do not prolapse).      Scattered medium-mouthed diverticula were found in the entire colon.      Five semi-pedunculated polyps were found in the descending colon and       ileocecal valve. The polyps were 4 to 6 mm in size. These polyps were  removed with a cold snare. Resection and retrieval were complete.       Estimated blood loss was minimal. Estimated blood loss was minimal.      The exam was otherwise without abnormality on direct and retroflexion       views. Impression:               - Non-bleeding internal hemorrhoids.                           - Diverticulosis in the entire examined colon.                           - Five 4 to 6 mm polyps in the descending colon and                            at the ileocecal valve, removed with a cold snare.                            Resected and retrieved.                            - The examination was otherwise normal on direct                            and retroflexion views. Patient also has related to                            me new symptom of esophageal dysphagia. Moderate Sedation:      Moderate (conscious) sedation was personally administered by an       anesthesia professional. The following parameters were monitored: oxygen       saturation, heart rate, blood pressure, respiratory rate, EKG, adequacy       of pulmonary ventilation, and response to care. Recommendation:           - Patient has a contact number available for                            emergencies. The signs and symptoms of potential                            delayed complications were discussed with the                            patient. Return to normal activities tomorrow.                            Written discharge instructions were provided to the                            patient.                           - Advance diet as tolerated.                           -  Continue present medications.                           - Repeat colonoscopy after studies are complete for                            surveillance based on pathology results.                           - Return to GI office in 6 weeks to further assess                            dysphagia symptoms. Procedure Code(s):        --- Professional ---                           [phone], Colonoscopy, flexible; with removal of                            tumor(s), polyp(s), or other lesion(s) by snare                            technique Diagnosis Code(s):        --- Professional ---                           Z12.11, Encounter for screening for malignant                            neoplasm of colon                           K64.0, First degree hemorrhoids                           D12.4, Benign neoplasm of descending colon                           D12.0, Benign neoplasm of cecum                            K57.30, Diverticulosis of large intestine without                            perforation or abscess without bleeding CPT copyright 2022 American Medical Association. All rights reserved. The codes documented in this report are preliminary and upon coder review may  be revised to meet current compliance requirements. Cristopher Estimable. Coleston Dirosa, MD Norvel Richards, MD 12/27/2022 11:35:16 AM This report has been signed electronically. Number of Addenda: 0

## 2022-12-27 NOTE — Transfer of Care (Signed)
Immediate Anesthesia Transfer of Care Note  Patient: Tami Dickson  Procedure(s) Performed: COLONOSCOPY WITH PROPOFOL POLYPECTOMY  Patient Location: Endoscopy Unit  Anesthesia Type:General  Level of Consciousness: awake, alert , oriented, and patient cooperative  Airway & Oxygen Therapy: Patient Spontanous Breathing  Post-op Assessment: Report given to RN, Post -op Vital signs reviewed and stable, and Patient moving all extremities  Post vital signs: Reviewed and stable  Last Vitals:  Vitals Value Taken Time  BP    Temp    Pulse    Resp    SpO2      Last Pain:  Vitals:   12/27/22 1104  TempSrc:   PainSc: 0-No pain      Patients Stated Pain Goal: 6 (72/90/21 1155)  Complications: No notable events documented.

## 2022-12-27 NOTE — Anesthesia Postprocedure Evaluation (Signed)
Anesthesia Post Note  Patient: Tami Dickson  Procedure(s) Performed: COLONOSCOPY WITH PROPOFOL POLYPECTOMY  Patient location during evaluation: Phase II Anesthesia Type: General Level of consciousness: awake and alert and oriented Pain management: pain level controlled Vital Signs Assessment: post-procedure vital signs reviewed and stable Respiratory status: spontaneous breathing, nonlabored ventilation and respiratory function stable Cardiovascular status: blood pressure returned to baseline and stable Postop Assessment: no apparent nausea or vomiting Anesthetic complications: no  No notable events documented.   Last Vitals:  Vitals:   12/27/22 1129 12/27/22 1134  BP: 121/63 (!) 126/54  Pulse: 71 77  Resp: 19 19  Temp:    SpO2: 97%     Last Pain:  Vitals:   12/27/22 1140  TempSrc:   PainSc: 0-No pain                 Tami Dickson

## 2022-12-27 NOTE — Discharge Instructions (Addendum)
  Colonoscopy Discharge Instructions  Read the instructions outlined below and refer to this sheet in the next few weeks. These discharge instructions provide you with general information on caring for yourself after you leave the hospital. Your doctor may also give you specific instructions. While your treatment has been planned according to the most current medical practices available, unavoidable complications occasionally occur. If you have any problems or questions after discharge, call Dr. Gala Romney at [phone]. ACTIVITY You may resume your regular activity, but move at a slower pace for the next 24 hours.  Take frequent rest periods for the next 24 hours.  Walking will help get rid of the air and reduce the bloated feeling in your belly (abdomen).  No driving for 24 hours (because of the medicine (anesthesia) used during the test).   Do not sign any important legal documents or operate any machinery for 24 hours (because of the anesthesia used during the test).  NUTRITION Drink plenty of fluids.  You may resume your normal diet as instructed by your doctor.  Begin with a light meal and progress to your normal diet. Heavy or fried foods are harder to digest and may make you feel sick to your stomach (nauseated).  Avoid alcoholic beverages for 24 hours or as instructed.  MEDICATIONS You may resume your normal medications unless your doctor tells you otherwise.  WHAT YOU CAN EXPECT TODAY Some feelings of bloating in the abdomen.  Passage of more gas than usual.  Spotting of blood in your stool or on the toilet paper.  IF YOU HAD POLYPS REMOVED DURING THE COLONOSCOPY: No aspirin products for 7 days or as instructed.  No alcohol for 7 days or as instructed.  Eat a soft diet for the next 24 hours.  FINDING OUT THE RESULTS OF YOUR TEST Not all test results are available during your visit. If your test results are not back during the visit, make an appointment with your caregiver to find out the  results. Do not assume everything is normal if you have not heard from your caregiver or the medical facility. It is important for you to follow up on all of your test results.  SEEK IMMEDIATE MEDICAL ATTENTION IF: You have more than a spotting of blood in your stool.  Your belly is swollen (abdominal distention).  You are nauseated or vomiting.  You have a temperature over 101.  You have abdominal pain or discomfort that is severe or gets worse throughout the day.       5 polyps removed from your colon today  Colon polyp and diverticulosis information provided   you have hemorrhoids as well.    Further recommendations to follow pending review of pathology report   follow-up appointment with  Venetia Night in our office regarding swallowing difficulties in 6 weeks   *****Message sent office to schedule************   at patient request, I called Doneta Public at [phone] unable to reach.

## 2022-12-27 NOTE — Anesthesia Preprocedure Evaluation (Addendum)
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 Dental  (+) Dental Advisory Given, Missing   Pulmonary shortness of breath and with exertion, asthma , COPD,  COPD inhaler   Pulmonary exam normal breath sounds clear to auscultation       Cardiovascular Exercise Tolerance: Good + Peripheral Vascular Disease  Normal cardiovascular exam Rhythm:Regular Rate:Normal     Neuro/Psych  Neuromuscular disease (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)  negative psych ROS   GI/Hepatic Neg liver ROS,GERD  Medicated and Controlled,,  Endo/Other  negative endocrine ROS    Renal/GU negative Renal ROS  negative genitourinary   Musculoskeletal negative musculoskeletal ROS (+)    Abdominal   Peds negative pediatric ROS (+)  Hematology negative hematology ROS (+)   Anesthesia Other Findings Back pain/fusion, closed angle glaucoma  Reproductive/Obstetrics negative OB ROS                             Anesthesia Physical Anesthesia Plan  ASA: 2  Anesthesia Plan: General   Post-op Pain Management: Minimal or no pain anticipated   Induction: Intravenous  PONV Risk Score and Plan: Propofol infusion  Airway Management Planned: Nasal Cannula and Natural Airway  Additional Equipment:   Intra-op Plan:   Post-operative Plan:   Informed Consent: I have reviewed the patients History and Physical, chart, labs and discussed the procedure including the risks, benefits and alternatives for the proposed anesthesia with the patient or authorized representative who has indicated  his/her understanding and acceptance.     Dental advisory given  Plan Discussed with: CRNA and Surgeon  Anesthesia Plan Comments:        Anesthesia Quick Evaluation

## 2022-12-27 NOTE — H&P (Signed)
$'@LOGO'T$ @   Primary Care Physician:  Caryl Bis, MD Primary Gastroenterologist:  Dr. Gala Romney  Pre-Procedure History & Physical: HPI:  Tami Dickson is a 72 y.o. adult here for   Recent intermittent blood in stool.  Possible distant colonoscopy years ago elsewhere details unavailable.  Actually, patient gives a sketchy history of prior lower endoscopic evaluation to [facility].  Past Medical History:  Diagnosis Date   Complication of anesthesia    COPD (chronic obstructive pulmonary disease) (HCC)    Dyspnea    GERD (gastroesophageal reflux disease)    Glaucoma    Peripheral vascular disease (HCC)    PONV (postoperative nausea and vomiting)     Past Surgical History:  Procedure Laterality Date   ABDOMINAL HYSTERECTOMY     EYE SURGERY Bilateral    ROTATOR CUFF REPAIR     TONSILLECTOMY     WRIST ARTHROSCOPY Left 06/30/2020   Procedure: ARTHROSCOPY WRIST POSSIBLE DEBRIDEMENT; OPEN STABILIZATION EXSTENSOR CARPI ULNARIS; DEEPENING ULNAR GROOVE;  Surgeon: Daryll Brod, MD;  Location: [place];  Service: Orthopedics;  Laterality: Left;   WRIST ARTHROSCOPY WITH FOVEAL TRIANGULAR FIBROCARTILAGE COMPLEX REPAIR Left 06/30/2020   Procedure: WRIST ARTHROSCOPY WITH LUNOTRIQUETRAL/TRIANGULAR FIBROCARTILAGE COMPLEX REPAIR;  Surgeon: Daryll Brod, MD;  Location: [place];  Service: Orthopedics;  Laterality: Left;  AXILLARY BLOCK    Prior to Admission medications   Medication Sig Start Date End Date Taking? Authorizing Provider  albuterol (PROVENTIL HFA;VENTOLIN HFA) 108 (90 Base) MCG/ACT inhaler Inhale 2 puffs into the lungs every 6 (six) hours as needed for wheezing or shortness of breath. 01/16/19  Yes Valentina Shaggy, MD  dorzolamide-timolol (COSOPT) 22.3-6.8 MG/ML ophthalmic solution Place 1 drop into both eyes 2 (two) times daily. 04/30/18  Yes [provider]  esomeprazole (NEXIUM) 40 MG capsule Take 40 mg by mouth daily as needed. 12/27/21   Yes [provider]  SYMBICORT 160-4.5 MCG/ACT inhaler Inhale 2 puffs into the lungs 2 (two) times daily. 08/21/19  Yes Valentina Shaggy, MD  albuterol (ACCUNEB) 0.63 MG/3ML nebulizer solution Take 1 ampule by nebulization every 6 (six) hours as needed for shortness of breath or wheezing.    [provider]  cyclobenzaprine (FLEXERIL) 5 MG tablet Take 1 tablet (5 mg total) by mouth 3 (three) times daily as needed for muscle spasms. 02/02/22   Viona Gilmore D, NP  erythromycin ophthalmic ointment Place 1 application into both eyes at bedtime. 08/26/18   [provider]  famotidine (PEPCID) 40 MG tablet Take 1 tablet (40 mg total) by mouth 2 (two) times a day. Patient not taking: Reported on 11/06/2022 04/22/19   Valentina Shaggy, MD  latanoprost (XALATAN) 0.005 % ophthalmic solution 1 drop at bedtime.    [provider]  pregabalin (LYRICA) 25 MG capsule     [provider]  rosuvastatin (CRESTOR) 5 MG tablet Take 5 mg by mouth at bedtime. 12/11/21   [provider]  Travoprost, BAK Free, (TRAVATAN) 0.004 % SOLN ophthalmic solution Place 1 drop into both eyes at bedtime. 12/23/21   [provider]  valACYclovir (VALTREX) 1000 MG tablet Take 2,000 mg by mouth 2 (two) times daily as needed (fever blisters). 12/27/21   [provider]    Allergies as of 11/23/2022 - Review Complete 11/06/2022  Allergen Reaction Noted   Atorvastatin Other (See Comments) 07/28/2013   Codeine Nausea And Vomiting 01/16/2019   Other  01/24/2022   Statins Other (See Comments) 05/21/2017  Family History  Problem Relation Age of Onset   Heart disease Mother    Varicose Veins Mother    Heart disease Father    Varicose Veins Sister    Hypertension Sister    Diabetes Son    Allergic rhinitis Neg Hx    Asthma Neg Hx    Colon polyps Neg Hx    Cancer - Colon Neg Hx     Social History   Socioeconomic History   Marital status: Widowed     Spouse name: Not on file   Number of children: 4   Years of education: Not on file   Highest education level: Not on file  Occupational History   Not on file  Tobacco Use   Smoking status: Never   Smokeless tobacco: Never  Vaping Use   Vaping Use: Never used  Substance and Sexual Activity   Alcohol use: Not Currently   Drug use: Never   Sexual activity: Not Currently  Other Topics Concern   Not on file  Social History Narrative   Not on file   Social Determinants of Health   Financial Resource Strain: Not on file  Food Insecurity: Not on file  Transportation Needs: Not on file  Physical Activity: Not on file  Stress: Not on file  Social Connections: Not on file  Intimate Partner Violence: Not on file    Review of Systems: See HPI, otherwise negative ROS  Physical Exam: BP (!) 168/77   Pulse 84   Temp 97.7 F (36.5 C) (Oral)   Resp 14   Ht '5\' 3"'$  (1.6 m)   Wt 76.2 kg   SpO2 99%   BMI 29.76 kg/m  General:   Alert,  Well-developed, well-nourished, pleasant and cooperative in NAD Neck:  Supple; no masses or thyromegaly. No significant cervical adenopathy. Lungs:  Clear throughout to auscultation.   No wheezes, crackles, or rhonchi. No acute distress. Heart:  Regular rate and rhythm; no murmurs, clicks, rubs,  or gallops. Abdomen: Non-distended, normal bowel sounds.  Soft and nontender without appreciable mass or hepatosplenomegaly.  Pulses:  Normal pulses noted. Extremities:  Without clubbing or edema.  Impression/Plan:    72 year old lady with intermittent blood per rectum last colonoscopy in the distant past details unknown. I have offered this lady a diagnostic colonoscopy today for paper hematochezia.  The risks, benefits, limitations, alternatives and imponderables have been reviewed with the patient. Questions have been answered. All parties are agreeable.      Notice: This dictation was prepared with Dragon dictation along with smaller phrase technology.  Any transcriptional errors that result from this process are unintentional and may not be corrected upon review.

## 2022-12-28 LAB — SURGICAL PATHOLOGY

## 2022-12-29 ENCOUNTER — Encounter: Payer: Self-pay | Admitting: Internal Medicine

## 2023-01-01 ENCOUNTER — Encounter (HOSPITAL_COMMUNITY): Payer: Self-pay | Admitting: Internal Medicine

## 2023-01-14 DIAGNOSIS — K2100 Gastro-esophageal reflux disease with esophagitis, without bleeding: Secondary | ICD-10-CM | POA: Diagnosis not present

## 2023-01-14 DIAGNOSIS — R748 Abnormal levels of other serum enzymes: Secondary | ICD-10-CM | POA: Diagnosis not present

## 2023-01-14 DIAGNOSIS — E7849 Other hyperlipidemia: Secondary | ICD-10-CM | POA: Diagnosis not present

## 2023-01-17 DIAGNOSIS — Z6831 Body mass index (BMI) 31.0-31.9, adult: Secondary | ICD-10-CM | POA: Diagnosis not present

## 2023-01-17 DIAGNOSIS — K760 Fatty (change of) liver, not elsewhere classified: Secondary | ICD-10-CM | POA: Diagnosis not present

## 2023-01-17 DIAGNOSIS — J449 Chronic obstructive pulmonary disease, unspecified: Secondary | ICD-10-CM | POA: Diagnosis not present

## 2023-01-17 DIAGNOSIS — Z23 Encounter for immunization: Secondary | ICD-10-CM | POA: Diagnosis not present

## 2023-01-17 DIAGNOSIS — I10 Essential (primary) hypertension: Secondary | ICD-10-CM | POA: Diagnosis not present

## 2023-01-17 DIAGNOSIS — K2100 Gastro-esophageal reflux disease with esophagitis, without bleeding: Secondary | ICD-10-CM | POA: Diagnosis not present

## 2023-02-06 ENCOUNTER — Ambulatory Visit: Payer: PPO | Admitting: Gastroenterology

## 2023-02-18 DIAGNOSIS — R059 Cough, unspecified: Secondary | ICD-10-CM | POA: Diagnosis not present

## 2023-02-18 DIAGNOSIS — Z6830 Body mass index (BMI) 30.0-30.9, adult: Secondary | ICD-10-CM | POA: Diagnosis not present

## 2023-02-18 DIAGNOSIS — H6121 Impacted cerumen, right ear: Secondary | ICD-10-CM | POA: Diagnosis not present

## 2023-02-18 DIAGNOSIS — R030 Elevated blood-pressure reading, without diagnosis of hypertension: Secondary | ICD-10-CM | POA: Diagnosis not present

## 2023-02-18 DIAGNOSIS — R0789 Other chest pain: Secondary | ICD-10-CM | POA: Diagnosis not present

## 2023-04-02 DIAGNOSIS — K219 Gastro-esophageal reflux disease without esophagitis: Secondary | ICD-10-CM | POA: Diagnosis not present

## 2023-04-02 DIAGNOSIS — J449 Chronic obstructive pulmonary disease, unspecified: Secondary | ICD-10-CM | POA: Diagnosis not present

## 2023-04-02 DIAGNOSIS — E782 Mixed hyperlipidemia: Secondary | ICD-10-CM | POA: Diagnosis not present

## 2023-04-23 DIAGNOSIS — G510 Bell's palsy: Secondary | ICD-10-CM | POA: Diagnosis not present

## 2023-04-23 DIAGNOSIS — H1013 Acute atopic conjunctivitis, bilateral: Secondary | ICD-10-CM | POA: Diagnosis not present

## 2023-04-23 DIAGNOSIS — H35363 Drusen (degenerative) of macula, bilateral: Secondary | ICD-10-CM | POA: Diagnosis not present

## 2023-04-23 DIAGNOSIS — H0288B Meibomian gland dysfunction left eye, upper and lower eyelids: Secondary | ICD-10-CM | POA: Diagnosis not present

## 2023-04-23 DIAGNOSIS — H0288A Meibomian gland dysfunction right eye, upper and lower eyelids: Secondary | ICD-10-CM | POA: Diagnosis not present

## 2023-04-23 DIAGNOSIS — H402232 Chronic angle-closure glaucoma, bilateral, moderate stage: Secondary | ICD-10-CM | POA: Diagnosis not present

## 2023-04-23 DIAGNOSIS — H402233 Chronic angle-closure glaucoma, bilateral, severe stage: Secondary | ICD-10-CM | POA: Diagnosis not present

## 2023-04-23 DIAGNOSIS — Z961 Presence of intraocular lens: Secondary | ICD-10-CM | POA: Diagnosis not present

## 2023-05-23 DIAGNOSIS — E7849 Other hyperlipidemia: Secondary | ICD-10-CM | POA: Diagnosis not present

## 2023-05-23 DIAGNOSIS — I10 Essential (primary) hypertension: Secondary | ICD-10-CM | POA: Diagnosis not present

## 2023-05-23 DIAGNOSIS — Z1329 Encounter for screening for other suspected endocrine disorder: Secondary | ICD-10-CM | POA: Diagnosis not present

## 2023-05-30 DIAGNOSIS — K2100 Gastro-esophageal reflux disease with esophagitis, without bleeding: Secondary | ICD-10-CM | POA: Diagnosis not present

## 2023-05-30 DIAGNOSIS — K760 Fatty (change of) liver, not elsewhere classified: Secondary | ICD-10-CM | POA: Diagnosis not present

## 2023-05-30 DIAGNOSIS — I10 Essential (primary) hypertension: Secondary | ICD-10-CM | POA: Diagnosis not present

## 2023-05-30 DIAGNOSIS — Z6831 Body mass index (BMI) 31.0-31.9, adult: Secondary | ICD-10-CM | POA: Diagnosis not present

## 2023-05-30 DIAGNOSIS — J449 Chronic obstructive pulmonary disease, unspecified: Secondary | ICD-10-CM | POA: Diagnosis not present

## 2023-06-26 DIAGNOSIS — H402233 Chronic angle-closure glaucoma, bilateral, severe stage: Secondary | ICD-10-CM | POA: Diagnosis not present

## 2023-08-01 DIAGNOSIS — R059 Cough, unspecified: Secondary | ICD-10-CM | POA: Diagnosis not present

## 2023-08-25 IMAGING — CR DG LUMBAR SPINE 1V
1 series · 1 of 1 positions shown · non-contrast
Comparison: Portable cross-table lateral view 3413 hours compared
to 08/29/2021

CLINICAL DATA: L3-L4 PLIF, localization

EXAM:
LUMBAR SPINE - 1 VIEW

[lateral]
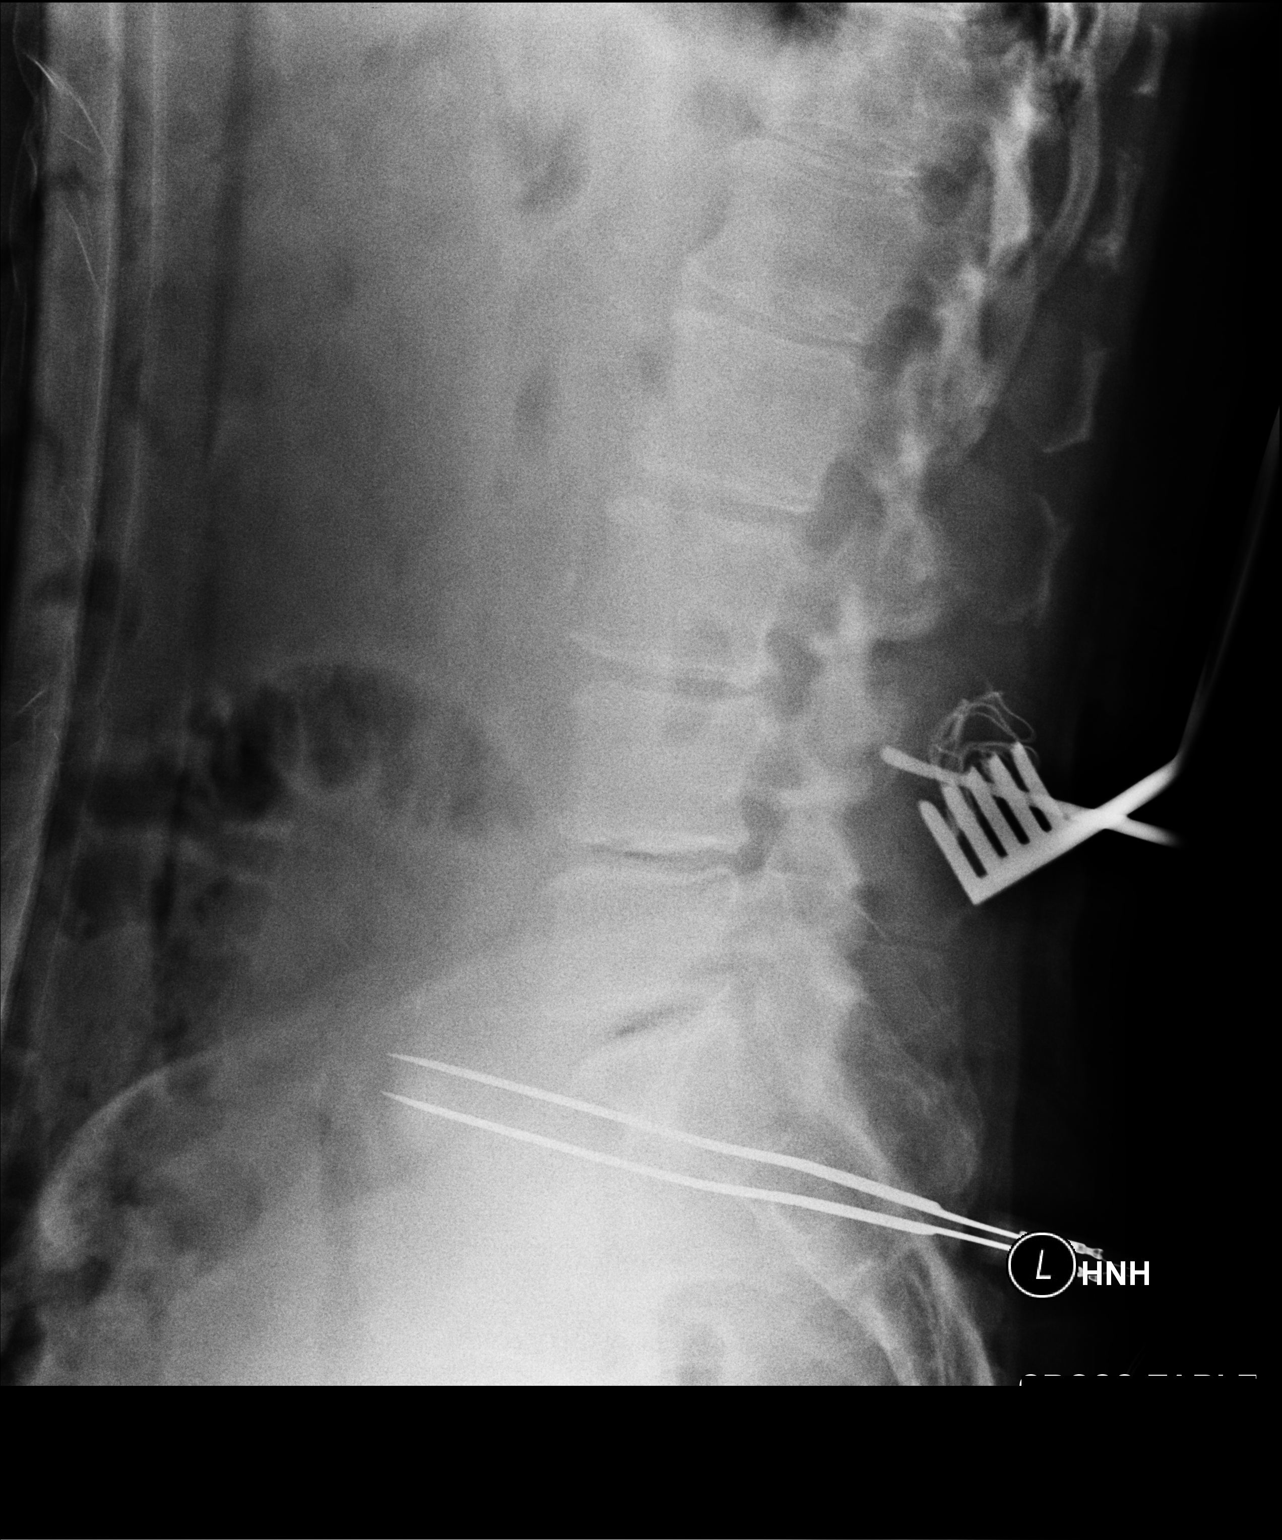

[1 of 1 positions shown; findings below may reference images not displayed]

FINDINGS: Prior exam demonstrates 5 lumbar vertebra.

Metallic probe via dorsal approach projects dorsal to the upper L4
level.

Additional surgical instruments and sponge project over the lower
lumbar region and sacrum.

Multilevel disc space narrowing.

Minimal anterolisthesis L3-L4 unchanged.
IMPRESSION: Posterior localization of the superior L4 level.

## 2023-08-25 IMAGING — RF DG LUMBAR SPINE 2-3V
1 series · 2 of 2 positions shown · non-contrast
Comparison: Lumbar spine x-rays from same day.

CLINICAL DATA: Lumbar spine surgery.

EXAM:
LUMBAR SPINE - 2-3 VIEW

[Series 1: dg x-ray · 0.14mm/px · 2 of 2 slices shown]
[im 1/2]
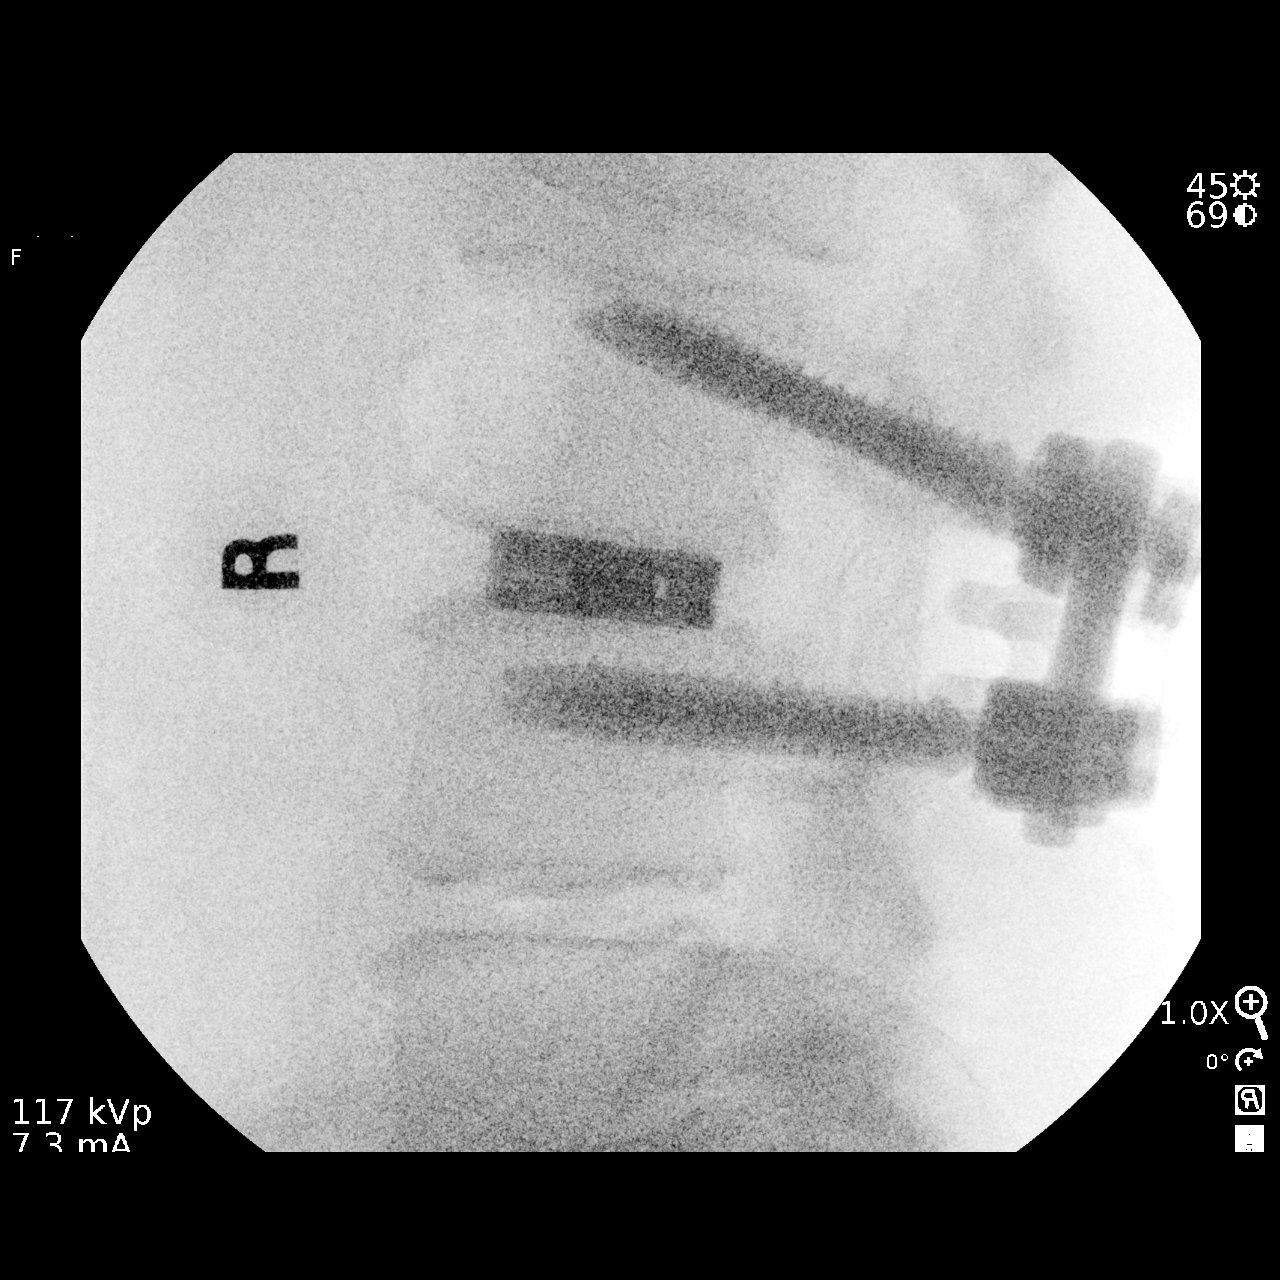
[im 2/2]
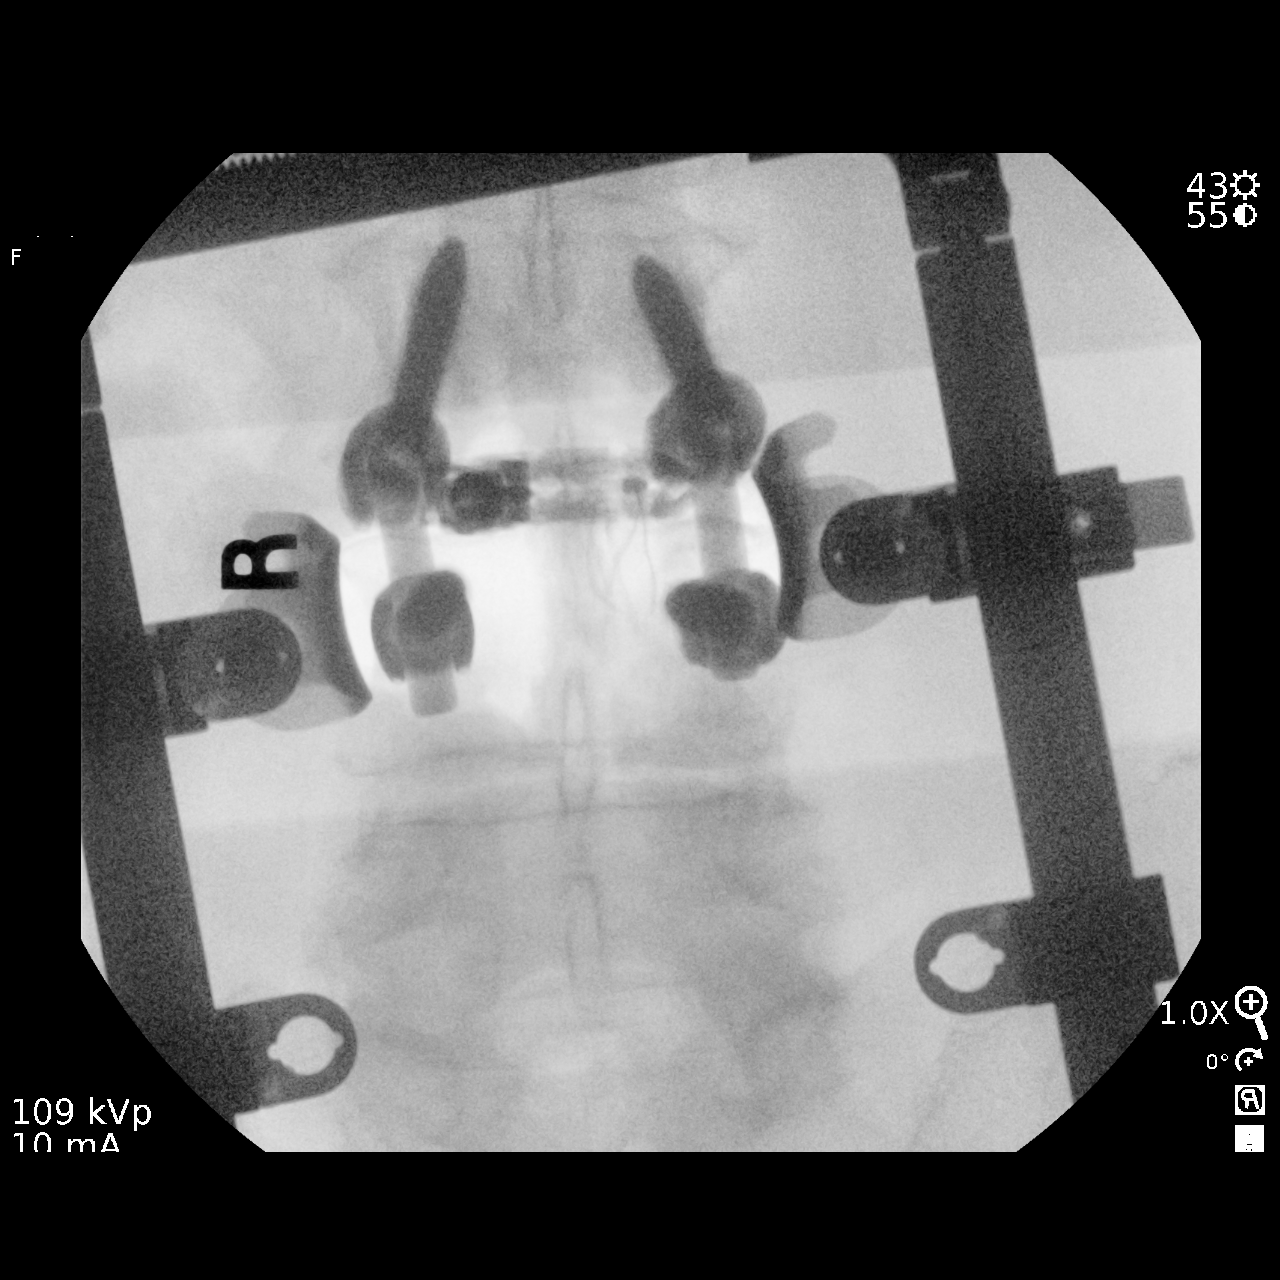

[2 of 2 positions shown; findings below may reference images not displayed]

FLUOROSCOPY TIME:  Radiation Exposure Index (as provided by the
fluoroscopic device): 13.03 mGy Kerma

C-arm fluoroscopic images were obtained intraoperatively and
submitted for post operative interpretation.
FINDINGS: AP and lateral intraoperative fluoroscopic images demonstrate
interval L3-L4 PLIF. No evident hardware complication. Unchanged
L4-L5 disc space narrowing. No acute osseous abnormality.
IMPRESSION: 1. Intraoperative fluoroscopic guidance for L3-L4 PLIF.

## 2023-09-02 DIAGNOSIS — E782 Mixed hyperlipidemia: Secondary | ICD-10-CM | POA: Diagnosis not present

## 2023-09-02 DIAGNOSIS — R413 Other amnesia: Secondary | ICD-10-CM | POA: Diagnosis not present

## 2023-09-19 DIAGNOSIS — Z1329 Encounter for screening for other suspected endocrine disorder: Secondary | ICD-10-CM | POA: Diagnosis not present

## 2023-09-19 DIAGNOSIS — Z1321 Encounter for screening for nutritional disorder: Secondary | ICD-10-CM | POA: Diagnosis not present

## 2023-09-19 DIAGNOSIS — Z0001 Encounter for general adult medical examination with abnormal findings: Secondary | ICD-10-CM | POA: Diagnosis not present

## 2023-09-19 DIAGNOSIS — I10 Essential (primary) hypertension: Secondary | ICD-10-CM | POA: Diagnosis not present

## 2023-09-19 DIAGNOSIS — E7849 Other hyperlipidemia: Secondary | ICD-10-CM | POA: Diagnosis not present

## 2023-09-26 DIAGNOSIS — H402233 Chronic angle-closure glaucoma, bilateral, severe stage: Secondary | ICD-10-CM | POA: Diagnosis not present

## 2023-10-14 DIAGNOSIS — J309 Allergic rhinitis, unspecified: Secondary | ICD-10-CM | POA: Diagnosis not present

## 2023-10-14 DIAGNOSIS — E785 Hyperlipidemia, unspecified: Secondary | ICD-10-CM | POA: Diagnosis not present

## 2023-10-14 DIAGNOSIS — F0390 Unspecified dementia without behavioral disturbance: Secondary | ICD-10-CM | POA: Diagnosis not present

## 2023-10-14 DIAGNOSIS — K219 Gastro-esophageal reflux disease without esophagitis: Secondary | ICD-10-CM | POA: Diagnosis not present

## 2023-10-14 DIAGNOSIS — G43909 Migraine, unspecified, not intractable, without status migrainosus: Secondary | ICD-10-CM | POA: Diagnosis not present

## 2023-10-14 DIAGNOSIS — E669 Obesity, unspecified: Secondary | ICD-10-CM | POA: Diagnosis not present

## 2023-10-14 DIAGNOSIS — G8929 Other chronic pain: Secondary | ICD-10-CM | POA: Diagnosis not present

## 2023-10-14 DIAGNOSIS — I10 Essential (primary) hypertension: Secondary | ICD-10-CM | POA: Diagnosis not present

## 2023-10-14 DIAGNOSIS — H401130 Primary open-angle glaucoma, bilateral, stage unspecified: Secondary | ICD-10-CM | POA: Diagnosis not present

## 2023-10-14 DIAGNOSIS — I739 Peripheral vascular disease, unspecified: Secondary | ICD-10-CM | POA: Diagnosis not present

## 2023-10-14 DIAGNOSIS — J449 Chronic obstructive pulmonary disease, unspecified: Secondary | ICD-10-CM | POA: Diagnosis not present

## 2023-10-14 DIAGNOSIS — H409 Unspecified glaucoma: Secondary | ICD-10-CM | POA: Diagnosis not present

## 2023-10-18 DIAGNOSIS — Z23 Encounter for immunization: Secondary | ICD-10-CM | POA: Diagnosis not present

## 2023-10-18 DIAGNOSIS — J449 Chronic obstructive pulmonary disease, unspecified: Secondary | ICD-10-CM | POA: Diagnosis not present

## 2023-10-18 DIAGNOSIS — Z6831 Body mass index (BMI) 31.0-31.9, adult: Secondary | ICD-10-CM | POA: Diagnosis not present

## 2023-10-18 DIAGNOSIS — I10 Essential (primary) hypertension: Secondary | ICD-10-CM | POA: Diagnosis not present

## 2023-10-18 DIAGNOSIS — K760 Fatty (change of) liver, not elsewhere classified: Secondary | ICD-10-CM | POA: Diagnosis not present

## 2023-10-18 DIAGNOSIS — Z0001 Encounter for general adult medical examination with abnormal findings: Secondary | ICD-10-CM | POA: Diagnosis not present

## 2023-10-18 DIAGNOSIS — K2100 Gastro-esophageal reflux disease with esophagitis, without bleeding: Secondary | ICD-10-CM | POA: Diagnosis not present

## 2023-10-22 DIAGNOSIS — R519 Headache, unspecified: Secondary | ICD-10-CM | POA: Diagnosis not present

## 2023-10-22 DIAGNOSIS — R413 Other amnesia: Secondary | ICD-10-CM | POA: Diagnosis not present

## 2023-10-22 DIAGNOSIS — S0990XA Unspecified injury of head, initial encounter: Secondary | ICD-10-CM | POA: Diagnosis not present

## 2023-10-22 DIAGNOSIS — R29810 Facial weakness: Secondary | ICD-10-CM | POA: Diagnosis not present

## 2023-11-26 DIAGNOSIS — Z1231 Encounter for screening mammogram for malignant neoplasm of breast: Secondary | ICD-10-CM | POA: Diagnosis not present

## 2023-12-06 DIAGNOSIS — M4316 Spondylolisthesis, lumbar region: Secondary | ICD-10-CM | POA: Diagnosis not present

## 2023-12-06 DIAGNOSIS — M48062 Spinal stenosis, lumbar region with neurogenic claudication: Secondary | ICD-10-CM | POA: Diagnosis not present

## 2024-02-07 DIAGNOSIS — C44319 Basal cell carcinoma of skin of other parts of face: Secondary | ICD-10-CM | POA: Diagnosis not present

## 2024-02-07 DIAGNOSIS — B079 Viral wart, unspecified: Secondary | ICD-10-CM | POA: Diagnosis not present

## 2024-02-07 DIAGNOSIS — L989 Disorder of the skin and subcutaneous tissue, unspecified: Secondary | ICD-10-CM | POA: Diagnosis not present

## 2024-03-02 DIAGNOSIS — K219 Gastro-esophageal reflux disease without esophagitis: Secondary | ICD-10-CM | POA: Diagnosis not present

## 2024-03-02 DIAGNOSIS — J449 Chronic obstructive pulmonary disease, unspecified: Secondary | ICD-10-CM | POA: Diagnosis not present

## 2024-03-02 DIAGNOSIS — E782 Mixed hyperlipidemia: Secondary | ICD-10-CM | POA: Diagnosis not present

## 2024-03-10 DIAGNOSIS — K760 Fatty (change of) liver, not elsewhere classified: Secondary | ICD-10-CM | POA: Diagnosis not present

## 2024-03-10 DIAGNOSIS — Z131 Encounter for screening for diabetes mellitus: Secondary | ICD-10-CM | POA: Diagnosis not present

## 2024-03-10 DIAGNOSIS — E782 Mixed hyperlipidemia: Secondary | ICD-10-CM | POA: Diagnosis not present

## 2024-03-10 DIAGNOSIS — I10 Essential (primary) hypertension: Secondary | ICD-10-CM | POA: Diagnosis not present

## 2024-03-10 DIAGNOSIS — E7849 Other hyperlipidemia: Secondary | ICD-10-CM | POA: Diagnosis not present

## 2024-03-10 DIAGNOSIS — J449 Chronic obstructive pulmonary disease, unspecified: Secondary | ICD-10-CM | POA: Diagnosis not present

## 2024-03-17 DIAGNOSIS — Z6833 Body mass index (BMI) 33.0-33.9, adult: Secondary | ICD-10-CM | POA: Diagnosis not present

## 2024-03-17 DIAGNOSIS — M25511 Pain in right shoulder: Secondary | ICD-10-CM | POA: Diagnosis not present

## 2024-03-17 DIAGNOSIS — I10 Essential (primary) hypertension: Secondary | ICD-10-CM | POA: Diagnosis not present

## 2024-03-17 DIAGNOSIS — Z2089 Contact with and (suspected) exposure to other communicable diseases: Secondary | ICD-10-CM | POA: Diagnosis not present

## 2024-03-17 DIAGNOSIS — Z20828 Contact with and (suspected) exposure to other viral communicable diseases: Secondary | ICD-10-CM | POA: Diagnosis not present

## 2024-03-17 DIAGNOSIS — E782 Mixed hyperlipidemia: Secondary | ICD-10-CM | POA: Diagnosis not present

## 2024-03-17 DIAGNOSIS — J449 Chronic obstructive pulmonary disease, unspecified: Secondary | ICD-10-CM | POA: Diagnosis not present

## 2024-03-17 DIAGNOSIS — K760 Fatty (change of) liver, not elsewhere classified: Secondary | ICD-10-CM | POA: Diagnosis not present

## 2024-04-01 DIAGNOSIS — K219 Gastro-esophageal reflux disease without esophagitis: Secondary | ICD-10-CM | POA: Diagnosis not present

## 2024-04-01 DIAGNOSIS — E782 Mixed hyperlipidemia: Secondary | ICD-10-CM | POA: Diagnosis not present

## 2024-04-01 DIAGNOSIS — J449 Chronic obstructive pulmonary disease, unspecified: Secondary | ICD-10-CM | POA: Diagnosis not present

## 2024-04-13 DIAGNOSIS — H402233 Chronic angle-closure glaucoma, bilateral, severe stage: Secondary | ICD-10-CM | POA: Diagnosis not present

## 2024-05-01 DIAGNOSIS — K219 Gastro-esophageal reflux disease without esophagitis: Secondary | ICD-10-CM | POA: Diagnosis not present

## 2024-05-01 DIAGNOSIS — E782 Mixed hyperlipidemia: Secondary | ICD-10-CM | POA: Diagnosis not present

## 2024-05-01 DIAGNOSIS — J449 Chronic obstructive pulmonary disease, unspecified: Secondary | ICD-10-CM | POA: Diagnosis not present

## 2024-06-01 DIAGNOSIS — E782 Mixed hyperlipidemia: Secondary | ICD-10-CM | POA: Diagnosis not present

## 2024-06-01 DIAGNOSIS — K219 Gastro-esophageal reflux disease without esophagitis: Secondary | ICD-10-CM | POA: Diagnosis not present

## 2024-06-01 DIAGNOSIS — J449 Chronic obstructive pulmonary disease, unspecified: Secondary | ICD-10-CM | POA: Diagnosis not present

## 2024-07-02 DIAGNOSIS — E782 Mixed hyperlipidemia: Secondary | ICD-10-CM | POA: Diagnosis not present

## 2024-07-02 DIAGNOSIS — K219 Gastro-esophageal reflux disease without esophagitis: Secondary | ICD-10-CM | POA: Diagnosis not present

## 2024-07-02 DIAGNOSIS — J449 Chronic obstructive pulmonary disease, unspecified: Secondary | ICD-10-CM | POA: Diagnosis not present

## 2024-07-31 DIAGNOSIS — K219 Gastro-esophageal reflux disease without esophagitis: Secondary | ICD-10-CM | POA: Diagnosis not present

## 2024-07-31 DIAGNOSIS — E782 Mixed hyperlipidemia: Secondary | ICD-10-CM | POA: Diagnosis not present

## 2024-07-31 DIAGNOSIS — J449 Chronic obstructive pulmonary disease, unspecified: Secondary | ICD-10-CM | POA: Diagnosis not present

## 2024-08-12 DIAGNOSIS — Z1329 Encounter for screening for other suspected endocrine disorder: Secondary | ICD-10-CM | POA: Diagnosis not present

## 2024-08-12 DIAGNOSIS — Z131 Encounter for screening for diabetes mellitus: Secondary | ICD-10-CM | POA: Diagnosis not present

## 2024-08-12 DIAGNOSIS — D559 Anemia due to enzyme disorder, unspecified: Secondary | ICD-10-CM | POA: Diagnosis not present

## 2024-08-12 DIAGNOSIS — Z1321 Encounter for screening for nutritional disorder: Secondary | ICD-10-CM | POA: Diagnosis not present

## 2024-08-12 DIAGNOSIS — K760 Fatty (change of) liver, not elsewhere classified: Secondary | ICD-10-CM | POA: Diagnosis not present

## 2024-08-12 DIAGNOSIS — E7849 Other hyperlipidemia: Secondary | ICD-10-CM | POA: Diagnosis not present

## 2024-08-12 DIAGNOSIS — K7689 Other specified diseases of liver: Secondary | ICD-10-CM | POA: Diagnosis not present

## 2024-08-12 DIAGNOSIS — I10 Essential (primary) hypertension: Secondary | ICD-10-CM | POA: Diagnosis not present

## 2024-08-19 DIAGNOSIS — Z6834 Body mass index (BMI) 34.0-34.9, adult: Secondary | ICD-10-CM | POA: Diagnosis not present

## 2024-08-19 DIAGNOSIS — E782 Mixed hyperlipidemia: Secondary | ICD-10-CM | POA: Diagnosis not present

## 2024-08-19 DIAGNOSIS — K219 Gastro-esophageal reflux disease without esophagitis: Secondary | ICD-10-CM | POA: Diagnosis not present

## 2024-08-19 DIAGNOSIS — I10 Essential (primary) hypertension: Secondary | ICD-10-CM | POA: Diagnosis not present

## 2024-08-19 DIAGNOSIS — J449 Chronic obstructive pulmonary disease, unspecified: Secondary | ICD-10-CM | POA: Diagnosis not present

## 2024-08-19 DIAGNOSIS — E7849 Other hyperlipidemia: Secondary | ICD-10-CM | POA: Diagnosis not present

## 2024-08-19 DIAGNOSIS — Z0001 Encounter for general adult medical examination with abnormal findings: Secondary | ICD-10-CM | POA: Diagnosis not present

## 2024-08-19 DIAGNOSIS — Z23 Encounter for immunization: Secondary | ICD-10-CM | POA: Diagnosis not present

## 2024-08-27 DIAGNOSIS — H402233 Chronic angle-closure glaucoma, bilateral, severe stage: Secondary | ICD-10-CM | POA: Diagnosis not present

## 2024-09-01 DIAGNOSIS — J449 Chronic obstructive pulmonary disease, unspecified: Secondary | ICD-10-CM | POA: Diagnosis not present

## 2024-09-01 DIAGNOSIS — K219 Gastro-esophageal reflux disease without esophagitis: Secondary | ICD-10-CM | POA: Diagnosis not present

## 2024-09-01 DIAGNOSIS — E782 Mixed hyperlipidemia: Secondary | ICD-10-CM | POA: Diagnosis not present

## 2024-10-02 DIAGNOSIS — K219 Gastro-esophageal reflux disease without esophagitis: Secondary | ICD-10-CM | POA: Diagnosis not present

## 2024-10-02 DIAGNOSIS — J449 Chronic obstructive pulmonary disease, unspecified: Secondary | ICD-10-CM | POA: Diagnosis not present

## 2024-10-02 DIAGNOSIS — E782 Mixed hyperlipidemia: Secondary | ICD-10-CM | POA: Diagnosis not present

## 2024-10-06 DIAGNOSIS — H02106 Unspecified ectropion of left eye, unspecified eyelid: Secondary | ICD-10-CM | POA: Diagnosis not present

## 2024-10-06 DIAGNOSIS — H04202 Unspecified epiphora, left lacrimal gland: Secondary | ICD-10-CM | POA: Diagnosis not present

## 2024-10-06 DIAGNOSIS — H029 Unspecified disorder of eyelid: Secondary | ICD-10-CM | POA: Diagnosis not present

## 2024-10-06 DIAGNOSIS — G510 Bell's palsy: Secondary | ICD-10-CM | POA: Diagnosis not present
